# Patient Record
Sex: Female | Born: 1950 | Race: Black or African American | Hispanic: No | Marital: Single | Smoking: Never smoker
Health system: Southern US, Community
[De-identification: ages and names within clinical notes are randomized; demographics above are authoritative.]

## PROBLEM LIST (undated history)

## (undated) DIAGNOSIS — I10 Essential (primary) hypertension: Secondary | ICD-10-CM

## (undated) DIAGNOSIS — E119 Type 2 diabetes mellitus without complications: Secondary | ICD-10-CM

## (undated) DIAGNOSIS — N289 Disorder of kidney and ureter, unspecified: Secondary | ICD-10-CM

## (undated) DIAGNOSIS — N186 End stage renal disease: Secondary | ICD-10-CM

---

## 2011-07-13 ENCOUNTER — Institutional Professional Consult (permissible substitution): Payer: Self-pay | Admitting: Internal Medicine

## 2018-06-15 ENCOUNTER — Other Ambulatory Visit: Payer: Self-pay

## 2018-06-15 ENCOUNTER — Emergency Department (HOSPITAL_COMMUNITY): Payer: Medicare Other

## 2018-06-15 ENCOUNTER — Encounter (HOSPITAL_COMMUNITY): Payer: Self-pay

## 2018-06-15 ENCOUNTER — Emergency Department (HOSPITAL_COMMUNITY)
Admission: EM | Admit: 2018-06-15 | Discharge: 2018-06-16 | Disposition: A | Discharge status: Home / Self Care | Payer: Medicare Other | Attending: Emergency Medicine | Admitting: Emergency Medicine

## 2018-06-15 DIAGNOSIS — R059 Cough, unspecified: Secondary | ICD-10-CM

## 2018-06-15 DIAGNOSIS — E119 Type 2 diabetes mellitus without complications: Secondary | ICD-10-CM | POA: Insufficient documentation

## 2018-06-15 DIAGNOSIS — R062 Wheezing: Secondary | ICD-10-CM | POA: Diagnosis not present

## 2018-06-15 DIAGNOSIS — R05 Cough: Secondary | ICD-10-CM

## 2018-06-15 DIAGNOSIS — I10 Essential (primary) hypertension: Secondary | ICD-10-CM | POA: Diagnosis not present

## 2018-06-15 HISTORY — DX: Type 2 diabetes mellitus without complications: E11.9

## 2018-06-15 HISTORY — DX: Essential (primary) hypertension: I10

## 2018-06-15 HISTORY — DX: Disorder of kidney and ureter, unspecified: N28.9

## 2018-06-15 LAB — COMPREHENSIVE METABOLIC PANEL
ALBUMIN: 3.4 g/dL — AB (ref 3.5–5.0)
ALT: 12 U/L — AB (ref 14–54)
AST: 18 U/L (ref 15–41)
Alkaline Phosphatase: 74 U/L (ref 38–126)
Anion gap: 12 (ref 5–15)
BUN: 14 mg/dL (ref 6–20)
CHLORIDE: 98 mmol/L — AB (ref 101–111)
CO2: 30 mmol/L (ref 22–32)
CREATININE: 6.37 mg/dL — AB (ref 0.44–1.00)
Calcium: 8.2 mg/dL — ABNORMAL LOW (ref 8.9–10.3)
GFR calc Af Amer: 7 mL/min — ABNORMAL LOW (ref >60)
GFR, EST NON AFRICAN AMERICAN: 6 mL/min — AB (ref >60)
GLUCOSE: 207 mg/dL — AB (ref 65–99)
Potassium: 4.4 mmol/L (ref 3.5–5.1)
Sodium: 140 mmol/L (ref 135–145)
Total Bilirubin: 0.9 mg/dL (ref 0.3–1.2)
Total Protein: 7.7 g/dL (ref 6.5–8.1)

## 2018-06-15 LAB — CBC
HEMATOCRIT: 39.7 % (ref 36.0–46.0)
HEMOGLOBIN: 11.8 g/dL — AB (ref 12.0–15.0)
MCH: 27.1 pg (ref 26.0–34.0)
MCHC: 29.7 g/dL — AB (ref 30.0–36.0)
MCV: 91.3 fL (ref 78.0–100.0)
Platelets: 245 10*3/uL (ref 150–400)
RBC: 4.35 MIL/uL (ref 3.87–5.11)
RDW: 16.5 % — ABNORMAL HIGH (ref 11.5–15.5)
WBC: 6.2 10*3/uL (ref 4.0–10.5)

## 2018-06-15 LAB — LIPASE, BLOOD: LIPASE: 24 U/L (ref 11–51)

## 2018-06-15 LAB — CBG MONITORING, ED: Glucose-Capillary: 136 mg/dL — ABNORMAL HIGH (ref 65–99)

## 2018-06-15 LAB — I-STAT TROPONIN, ED: Troponin i, poc: 0 ng/mL (ref 0.00–0.08)

## 2018-06-15 MED ORDER — ALBUTEROL SULFATE HFA 108 (90 BASE) MCG/ACT IN AERS: 2.0000 | INHALATION_SPRAY | RESPIRATORY_TRACT | 1 refills | Status: DC | PRN

## 2018-06-15 MED ORDER — GI COCKTAIL ~~LOC~~
30.0000 mL | Freq: Once | ORAL | Status: AC
  Administered 2018-06-15: 30 mL via ORAL
  Filled 2018-06-15: qty 30

## 2018-06-15 MED ORDER — FAMOTIDINE 20 MG PO TABS: 20.0000 mg | ORAL_TABLET | Freq: Two times a day (BID) | ORAL | 0 refills | Status: AC

## 2018-06-15 MED ORDER — BENZONATATE 100 MG PO CAPS: 200.0000 mg | ORAL_CAPSULE | Freq: Two times a day (BID) | ORAL | 0 refills | Status: AC | PRN

## 2018-06-15 MED ORDER — AEROCHAMBER PLUS W/MASK MISC: 2 refills | Status: DC

## 2018-06-15 MED ORDER — ACETAMINOPHEN 325 MG PO TABS
650.0000 mg | ORAL_TABLET | Freq: Once | ORAL | Status: AC
  Administered 2018-06-15: 650 mg via ORAL
  Filled 2018-06-15: qty 2

## 2018-06-15 MED ORDER — ALBUTEROL SULFATE (2.5 MG/3ML) 0.083% IN NEBU
5.0000 mg | INHALATION_SOLUTION | Freq: Once | RESPIRATORY_TRACT | Status: AC
  Administered 2018-06-15: 5 mg via RESPIRATORY_TRACT
  Filled 2018-06-15: qty 6

## 2018-06-15 NOTE — ED Provider Notes (Signed)
[place] EMERGENCY DEPARTMENT Provider Note   CSN: [id number] Arrival date & time: 06/15/18  1759     History   Chief Complaint Chief Complaint  Patient presents with  . Chest Pain    HPI Patricia Bradley is a 67 y.o. female who presents the emergency department with chief complaint of cough.  Patient has a past medical history of diabetes, hypertension, underlying lung disease, chronic kidney disease.  Patient was seen at the [facility] system on 05/25/2018 for productive cough.  She was treated for suspected pneumonia and states that she finished her antibiotic course on Friday, 06/10/2018.  Patient states that she was feeling much better and her cough had resolved however over the weekend she started feeling worse in my Monday she developed chest congestion, productive cough chills and body aches.  She is unsure of what antibiotic she took.  She has also had associated epigastric abdominal pain, nausea and worsening reflux symptoms.  She states that these also worsen when she eats any food.  She denies hemoptysis.  HPI  Past Medical History:  Diagnosis Date  . Diabetes mellitus without complication ([place])   . Hypertension   . Renal disorder       The histories are not reviewed yet. Please review them in the "History" navigator section and refresh this [place].   OB History   None      Home Medications    Prior to Admission medications   Not on File    Family History No family history on file.  Social History Social History   Tobacco Use  . Smoking status: Never Smoker  Substance Use Topics  . Alcohol use: Never    Frequency: Never  . Drug use: Never     Allergies   Patient has no known allergies.   Review of Systems Review of Systems Ten systems reviewed and are negative for acute change, except as noted in the HPI.    Physical Exam Updated Vital Signs BP (!) 157/114 (BP Location: Left Arm)   Pulse 77   Temp 100 F (37.8  C) (Oral)   Resp 18   Ht 5' 4.75" (1.645 m)   Wt 118.4 kg (261 lb)   SpO2 99%   BMI 43.77 kg/m   Physical Exam  Constitutional: She is oriented to person, place, and time. She appears well-developed and well-nourished. No distress.  HENT:  Head: Normocephalic and atraumatic.  Eyes: Pupils are equal, round, and reactive to light. Conjunctivae and EOM are normal. No scleral icterus.  Neck: Normal range of motion. No JVD present.  Cardiovascular: Normal rate, regular rhythm and normal heart sounds. Exam reveals no gallop and no friction rub.  No murmur heard. Pulmonary/Chest: Effort normal and breath sounds normal. No respiratory distress. She has no wheezes.  Abdominal: Soft. Bowel sounds are normal. She exhibits no distension and no mass. There is no tenderness. There is no guarding.  Musculoskeletal:       Right lower leg: She exhibits no edema.       Left lower leg: She exhibits no edema.  Neurological: She is alert and oriented to person, place, and time.  Skin: Skin is warm and dry. She is not diaphoretic.  Psychiatric: Her behavior is normal.  Nursing note and vitals reviewed.    ED Treatments / Results  Labs (all labs ordered are listed, but only abnormal results are displayed) Labs Reviewed  CBC - Abnormal; Notable for the following components:  Result Value   Hemoglobin 11.8 (*)    MCHC 29.7 (*)    RDW 16.5 (*)    All other components within normal limits  COMPREHENSIVE METABOLIC PANEL - Abnormal; Notable for the following components:   Chloride 98 (*)    Glucose, Bld 207 (*)    Creatinine, Ser 6.37 (*)    Calcium 8.2 (*)    Albumin 3.4 (*)    ALT 12 (*)    GFR calc non Af Amer 6 (*)    GFR calc Af Amer 7 (*)    All other components within normal limits  LIPASE, BLOOD  URINALYSIS, ROUTINE W REFLEX MICROSCOPIC  I-STAT TROPONIN, ED    EKG EKG Interpretation  Date/Time:  Wednesday June 15 2018 18:07:58 EDT Ventricular Rate:  75 PR Interval:  172 QRS  Duration: 66 QT Interval:  416 QTC Calculation: 464 R Axis:   -78 Text Interpretation:  Normal sinus rhythm Left axis deviation Low voltage QRS Cannot rule out Anteroseptal infarct , age undetermined Abnormal ECG no prior avaiable for comparison Confirmed by Quintella Reichert [phone]) on 06/15/2018 6:49:03 PM   Radiology Dg Chest 2 View  Result Date: 06/15/2018 CLINICAL DATA:  Mid chest pain intermittently for 3 days. EXAM: CHEST - 2 VIEW COMPARISON:  None. FINDINGS: The heart size and mediastinal contours are within normal limits. Mild bibasilar atelectasis. No pulmonary consolidation, effusion or pneumothorax. No overt pulmonary edema. The visualized skeletal structures are unremarkable. IMPRESSION: No active cardiopulmonary disease. Electronically Signed   By: Ashley Royalty M.D.   On: 06/15/2018 19:28    Procedures Procedures (including critical care time)  Medications Ordered in ED Medications  acetaminophen (TYLENOL) tablet 650 mg (650 mg Oral Given 06/15/18 1813)     Initial Impression / Assessment and Plan / ED Course  I have reviewed the triage vital signs and the nursing notes.  Pertinent labs & imaging results that were available during my care of the patient were reviewed by me and considered in my medical decision making (see chart for details).     Patient with cough.  Chest x-ray negative.  Improved after albuterol treatment.  Feel that her cough is likely multifactorial and may also be secondary to her chronic reflux.  Symptoms improved with treatment.  Patient will be discharged with Tessalon, albuterol, and I have added Pepcid to her daily regimen of PPI.  Suggest also dietary modifications.  Patient hemodynamically stable with normal oxygen saturations.  She appears appropriate for discharge at this time  Final Clinical Impressions(s) / ED Diagnoses   Final diagnoses:  Cough  Wheezing    ED Discharge Orders    None       Margarita Mail, PA-C 06/15/18 2358      Quintella Reichert, MD 06/22/18 [phone]

## 2018-06-15 NOTE — ED Notes (Signed)
Pt to xray

## 2018-06-15 NOTE — ED Notes (Signed)
Pt CBG was 136, notified Chris(RN)

## 2018-06-15 NOTE — ED Provider Notes (Signed)
Patient placed in Quick Look pathway, seen and evaluated   Chief Complaint: Chest pain, SOB, Abdominal pain  HPI:   67 year old female with ESRD presents with chest pain for one day. She reports associated cough, SOB, abdominal pain, and nausea. No diarrhea or urinary symptoms. She completed dialysis today. She has been on antibiotics recently for a cough and her cough improved however her cough has worsened again.  ROS: +chest pain, SOB, cough, abdominal pain  Physical Exam:   Gen: No distress  Neuro: Awake and Alert  Skin: Warm    Focused Exam: Heart: Regular rate and rhythm    Lungs: CTA    Abdomen: Soft, tender in epigastric area   Initiation of care has begun. The patient has been counseled on the process, plan, and necessity for staying for the completion/evaluation, and the remainder of the medical screening examination    Recardo Evangelist, PA-C 06/15/18 1811    Quintella Reichert, MD 06/17/18 [phone]

## 2018-06-15 NOTE — Discharge Instructions (Addendum)
Contact a health care provider if:  You have new symptoms.  You cough up pus.  Your cough does not get better after 2-3 weeks, or your cough gets worse.  You cannot control your cough with suppressant medicines and you are losing sleep.  You develop pain that is getting worse or pain that is not controlled with pain medicines.  You have a fever.  You have unexplained weight loss.  You have night sweats.  Get help right away if:  You cough up blood.  You have difficulty breathing.  Your heartbeat is very fast.

## 2018-06-15 NOTE — ED Triage Notes (Addendum)
Pt endorses central non radiating chest pain since yesterday with dizziness, shob, and nausea. Oral temp 100 in triage. Pt is on dialysis m-w-fr. Pt had full dialysis treatment today.

## 2018-06-15 NOTE — ED Notes (Signed)
The pt has multiple complaints chest abdiomen shortness of breath hypertension  She has had all for awhile  But she reports worse in the past 3-4 days

## 2020-12-07 ENCOUNTER — Emergency Department (HOSPITAL_BASED_OUTPATIENT_CLINIC_OR_DEPARTMENT_OTHER): Payer: Medicare Other

## 2020-12-07 ENCOUNTER — Encounter (HOSPITAL_BASED_OUTPATIENT_CLINIC_OR_DEPARTMENT_OTHER): Payer: Self-pay | Admitting: Emergency Medicine

## 2020-12-07 ENCOUNTER — Emergency Department (HOSPITAL_BASED_OUTPATIENT_CLINIC_OR_DEPARTMENT_OTHER)
Admission: EM | Admit: 2020-12-07 | Discharge: 2020-12-07 | Disposition: A | Discharge status: Home / Self Care | Payer: Medicare Other | Attending: Emergency Medicine | Admitting: Emergency Medicine

## 2020-12-07 ENCOUNTER — Other Ambulatory Visit: Payer: Self-pay

## 2020-12-07 DIAGNOSIS — R202 Paresthesia of skin: Secondary | ICD-10-CM | POA: Insufficient documentation

## 2020-12-07 DIAGNOSIS — E041 Nontoxic single thyroid nodule: Secondary | ICD-10-CM | POA: Insufficient documentation

## 2020-12-07 DIAGNOSIS — I10 Essential (primary) hypertension: Secondary | ICD-10-CM | POA: Insufficient documentation

## 2020-12-07 DIAGNOSIS — K5730 Diverticulosis of large intestine without perforation or abscess without bleeding: Secondary | ICD-10-CM | POA: Insufficient documentation

## 2020-12-07 DIAGNOSIS — Z79899 Other long term (current) drug therapy: Secondary | ICD-10-CM | POA: Insufficient documentation

## 2020-12-07 DIAGNOSIS — E119 Type 2 diabetes mellitus without complications: Secondary | ICD-10-CM | POA: Insufficient documentation

## 2020-12-07 DIAGNOSIS — R0602 Shortness of breath: Secondary | ICD-10-CM | POA: Insufficient documentation

## 2020-12-07 DIAGNOSIS — R911 Solitary pulmonary nodule: Secondary | ICD-10-CM | POA: Diagnosis not present

## 2020-12-07 DIAGNOSIS — Z794 Long term (current) use of insulin: Secondary | ICD-10-CM | POA: Insufficient documentation

## 2020-12-07 DIAGNOSIS — Z992 Dependence on renal dialysis: Secondary | ICD-10-CM | POA: Diagnosis not present

## 2020-12-07 DIAGNOSIS — Z20822 Contact with and (suspected) exposure to covid-19: Secondary | ICD-10-CM | POA: Insufficient documentation

## 2020-12-07 DIAGNOSIS — I2510 Atherosclerotic heart disease of native coronary artery without angina pectoris: Secondary | ICD-10-CM | POA: Diagnosis not present

## 2020-12-07 DIAGNOSIS — R0789 Other chest pain: Secondary | ICD-10-CM | POA: Diagnosis not present

## 2020-12-07 DIAGNOSIS — Z7982 Long term (current) use of aspirin: Secondary | ICD-10-CM | POA: Insufficient documentation

## 2020-12-07 DIAGNOSIS — I700 Atherosclerosis of aorta: Secondary | ICD-10-CM | POA: Diagnosis not present

## 2020-12-07 DIAGNOSIS — R197 Diarrhea, unspecified: Secondary | ICD-10-CM | POA: Diagnosis present

## 2020-12-07 DIAGNOSIS — J989 Respiratory disorder, unspecified: Secondary | ICD-10-CM

## 2020-12-07 DIAGNOSIS — B349 Viral infection, unspecified: Secondary | ICD-10-CM | POA: Diagnosis not present

## 2020-12-07 LAB — CBC WITH DIFFERENTIAL/PLATELET
Abs Immature Granulocytes: 0.03 10*3/uL (ref 0.00–0.07)
Basophils Absolute: 0.1 10*3/uL (ref 0.0–0.1)
Basophils Relative: 1 %
Eosinophils Absolute: 0.1 10*3/uL (ref 0.0–0.5)
Eosinophils Relative: 1 %
HCT: 36.3 % (ref 36.0–46.0)
Hemoglobin: 11.2 g/dL — ABNORMAL LOW (ref 12.0–15.0)
Immature Granulocytes: 0 %
Lymphocytes Relative: 20 %
Lymphs Abs: 2.1 10*3/uL (ref 0.7–4.0)
MCH: 27.9 pg (ref 26.0–34.0)
MCHC: 30.9 g/dL (ref 30.0–36.0)
MCV: 90.3 fL (ref 80.0–100.0)
Monocytes Absolute: 0.6 10*3/uL (ref 0.1–1.0)
Monocytes Relative: 6 %
Neutro Abs: 7.7 10*3/uL (ref 1.7–7.7)
Neutrophils Relative %: 72 %
Platelets: 300 10*3/uL (ref 150–400)
RBC: 4.02 MIL/uL (ref 3.87–5.11)
RDW: 15.8 % — ABNORMAL HIGH (ref 11.5–15.5)
WBC: 10.6 10*3/uL — ABNORMAL HIGH (ref 4.0–10.5)
nRBC: 0 % (ref 0.0–0.2)

## 2020-12-07 LAB — COMPREHENSIVE METABOLIC PANEL
ALT: 14 U/L (ref 0–44)
AST: 15 U/L (ref 15–41)
Albumin: 3.5 g/dL (ref 3.5–5.0)
Alkaline Phosphatase: 81 U/L (ref 38–126)
Anion gap: 16 — ABNORMAL HIGH (ref 5–15)
BUN: 44 mg/dL — ABNORMAL HIGH (ref 8–23)
CO2: 25 mmol/L (ref 22–32)
Calcium: 7.8 mg/dL — ABNORMAL LOW (ref 8.9–10.3)
Chloride: 93 mmol/L — ABNORMAL LOW (ref 98–111)
Creatinine, Ser: 9.44 mg/dL — ABNORMAL HIGH (ref 0.44–1.00)
GFR, Estimated: 4 mL/min — ABNORMAL LOW (ref >60)
Glucose, Bld: 294 mg/dL — ABNORMAL HIGH (ref 70–99)
Potassium: 4.4 mmol/L (ref 3.5–5.1)
Sodium: 134 mmol/L — ABNORMAL LOW (ref 135–145)
Total Bilirubin: 0.3 mg/dL (ref 0.3–1.2)
Total Protein: 7.8 g/dL (ref 6.5–8.1)

## 2020-12-07 LAB — RESP PANEL BY RT-PCR (FLU A&B, COVID) ARPGX2
Influenza A by PCR: NEGATIVE
Influenza B by PCR: NEGATIVE
SARS Coronavirus 2 by RT PCR: NEGATIVE

## 2020-12-07 LAB — TROPONIN I (HIGH SENSITIVITY)
Troponin I (High Sensitivity): 9 ng/L (ref <18)
Troponin I (High Sensitivity): 9 ng/L (ref <18)

## 2020-12-07 LAB — LIPASE, BLOOD: Lipase: 24 U/L (ref 11–51)

## 2020-12-07 LAB — BRAIN NATRIURETIC PEPTIDE: B Natriuretic Peptide: 30 pg/mL (ref 0.0–100.0)

## 2020-12-07 LAB — MAGNESIUM: Magnesium: 1.9 mg/dL (ref 1.7–2.4)

## 2020-12-07 MED ORDER — IOHEXOL 300 MG/ML  SOLN: 100.0000 mL | Freq: Once | INTRAMUSCULAR | Status: DC

## 2020-12-07 NOTE — ED Provider Notes (Signed)
[place] EMERGENCY DEPARTMENT Provider Note   CSN: [id number] Arrival date & time: 12/07/20  1620     History Chief Complaint  Patient presents with  . Chest Pain  . Diarrhea    Patricia Bradley is a 69 y.o. female.  Patient is a dialysis patient.  Normally dialyzed Monday Wednesdays and Fridays.  Was dialyzed on Friday.  She is got multiple complaints.  1 is chest pain for 3 to 4 days intermittent in nature.  Would last for 30 minutes when it occurs.  Also generalized weakness not feeling well fell Thursday but did had no injuries.  Other symptoms include cough chills some diarrhea nausea but no vomiting as she had 4 loose bowel movements today.  Has had both Covid vaccines.  No fever.  Also had some mild numbness to both bilateral upper extremities.  All the symptoms have been present for 3 to 4 days.  He has been receiving dialysis for 4 years.        Past Medical History:  Diagnosis Date  . Diabetes mellitus without complication ([place])   . Hypertension   . Renal disorder     There are no problems to display for this patient.   History reviewed. No pertinent surgical history.   OB History   No obstetric history on file.     No family history on file.  Social History   Tobacco Use  . Smoking status: Never Smoker  . Smokeless tobacco: Never Used  Substance Use Topics  . Alcohol use: Never  . Drug use: Never    Home Medications Prior to Admission medications   Medication Sig Start Date End Date Taking? Authorizing Provider  albuterol (PROVENTIL HFA;VENTOLIN HFA) 108 (90 Base) MCG/ACT inhaler Inhale 2 puffs into the lungs every 6 (six) hours as needed for wheezing or shortness of breath.    [provider]  albuterol (PROVENTIL HFA;VENTOLIN HFA) 108 (90 Base) MCG/ACT inhaler Inhale 2 puffs into the lungs every 4 (four) hours as needed for wheezing or shortness of breath. 06/15/18   Margarita Mail, PA-C  ALPRAZolam Duanne Moron) 0.25 MG tablet  Take 0.25 mg by mouth daily as needed for anxiety.    [provider]  amLODipine (NORVASC) 5 MG tablet Take 5 mg by mouth daily.    [provider]  aspirin EC 81 MG tablet Take 81 mg by mouth daily.    [provider]  benzonatate (TESSALON) 100 MG capsule Take 2 capsules (200 mg total) by mouth 2 (two) times daily as needed for cough. 06/15/18   Margarita Mail, PA-C  calcium acetate (PHOSLO) 667 MG capsule Take [phone] mg by mouth See admin instructions. Take 1334mg  three times a day after meals, Take 667mg  after snacks    [provider]  cetirizine (ZYRTEC) 10 MG tablet Take 5 mg by mouth daily.    [provider]  Cholecalciferol 2000 units CAPS Take 2,000 Units by mouth daily.    [provider]  clobetasol ointment (TEMOVATE) 6.20 % Apply 1 application topically daily. To feet    [provider]  clotrimazole-betamethasone (LOTRISONE) lotion Apply 1 application topically 2 (two) times daily.    [provider]  esomeprazole (NEXIUM) 40 MG capsule Take 40 mg by mouth daily at 12 noon.    [provider]  famotidine (PEPCID) 20 MG tablet Take 1 tablet (20 mg total) by mouth 2 (two) times daily. 06/15/18   Margarita Mail, PA-C  febuxostat (ULORIC) 40 MG  tablet Take 40 mg by mouth daily.    [provider]  fluticasone (FLONASE) 50 MCG/ACT nasal spray Place 1 spray into both nostrils daily.    [provider]  Fluticasone-Salmeterol (ADVAIR) 100-50 MCG/DOSE AEPB Inhale 1 puff into the lungs 2 (two) times daily.    [provider]  gabapentin (NEURONTIN) 300 MG capsule Take 300 mg by mouth at bedtime.    [provider]  hydrocortisone 2.5 % cream Apply 1 application topically 2 (two) times daily.    [provider]  Insulin Glargine (BASAGLAR KWIKPEN) 100 UNIT/ML SOPN Inject 10 Units into the skin at bedtime.    [provider]  insulin lispro (HUMALOG KWIKPEN)  100 UNIT/ML KiwkPen Inject 3 Units into the skin 3 (three) times daily. Before meals    [provider]  ipratropium (ATROVENT) 0.06 % nasal spray Place 2 sprays into both nostrils 3 (three) times daily.    [provider]  isosorbide mononitrate (IMDUR) 60 MG 24 hr tablet Take 60 mg by mouth daily.    [provider]  lidocaine (XYLOCAINE) 5 % ointment Apply 1 application topically daily as needed for mild pain.    [provider]  metoprolol succinate (TOPROL-XL) 50 MG 24 hr tablet Take 50 mg by mouth daily. Take with or immediately following a meal.    [provider]  Multiple Vitamins-Minerals (CENTRUM SILVER 50+WOMEN) TABS Take 1 tablet by mouth daily.    [provider]  Naftifine HCl (NAFTIN) 2 % CREA Apply 1 g topically 2 (two) times daily. Starting at feet    [provider]  nitroGLYCERIN (NITROSTAT) 0.4 MG SL tablet Place 0.4 mg under the tongue every 5 (five) minutes as needed for chest pain.    [provider]  NON FORMULARY CPAP Nightly    [provider]  omega-3 fish oil (MAXEPA) 1000 MG CAPS capsule Take 1 capsule by mouth daily.    [provider]  ondansetron (ZOFRAN-ODT) 4 MG disintegrating tablet Take 4 mg by mouth every 8 (eight) hours as needed for nausea or vomiting.    [provider]  rosuvastatin (CRESTOR) 10 MG tablet Take 10 mg by mouth daily.    [provider]  Spacer/Aero-Holding Chambers (AEROCHAMBER PLUS WITH MASK) inhaler Use as instructed 06/15/18   Margarita Mail, PA-C  topiramate (TOPAMAX) 100 MG tablet Take 100 mg by mouth at bedtime.    [provider]  torsemide (DEMADEX) 20 MG tablet Take 40 mg by mouth daily.    [provider]    Allergies    Tramadol, Memantine, Metformin, Penicillins, Contrast media [iodinated diagnostic agents], Aspirin, Fentanyl, and Hydrocodone-acetaminophen  Review of Systems   Review of Systems   Constitutional: Positive for chills and fatigue. Negative for fever.  HENT: Negative for congestion, rhinorrhea and sore throat.   Eyes: Negative for visual disturbance.  Respiratory: Positive for cough. Negative for shortness of breath.   Cardiovascular: Positive for chest pain. Negative for leg swelling.  Gastrointestinal: Positive for diarrhea and nausea. Negative for abdominal pain and vomiting.  Genitourinary: Negative for dysuria.  Musculoskeletal: Negative for back pain and neck pain.  Skin: Negative for rash.  Neurological: Positive for numbness. Negative for dizziness, light-headedness and headaches.  Hematological: Does not bruise/bleed easily.  Psychiatric/Behavioral: Negative for confusion.    Physical Exam Updated Vital Signs BP (!) 98/55 (BP Location: Left Arm)   Pulse 74   Temp 98 F (36.7 C) (Oral)   Resp  16   Ht 1.626 m (5\' 4" )   Wt 97.1 kg   SpO2 96%   BMI 36.73 kg/m   Physical Exam Vitals and nursing note reviewed.  Constitutional:      General: She is not in acute distress.    Appearance: Normal appearance. She is well-developed and well-nourished. She is not ill-appearing or toxic-appearing.  HENT:     Head: Normocephalic and atraumatic.     Mouth/Throat:     Mouth: Mucous membranes are moist.  Eyes:     Extraocular Movements: Extraocular movements intact.     Conjunctiva/sclera: Conjunctivae normal.     Pupils: Pupils are equal, round, and reactive to light.  Cardiovascular:     Rate and Rhythm: Normal rate and regular rhythm.     Heart sounds: No murmur heard.   Pulmonary:     Effort: Pulmonary effort is normal. No respiratory distress.     Breath sounds: Normal breath sounds.  Abdominal:     Palpations: Abdomen is soft.     Tenderness: There is no abdominal tenderness.  Musculoskeletal:        General: No swelling or edema. Normal range of motion.     Cervical back: Normal range of motion and neck supple.     Comments: AV fistula right  forearm.  With good thrill  Skin:    General: Skin is warm and dry.     Capillary Refill: Capillary refill takes less than 2 seconds.  Neurological:     General: No focal deficit present.     Mental Status: She is alert and oriented to person, place, and time.     Cranial Nerves: No cranial nerve deficit.     Sensory: No sensory deficit.     Motor: No weakness.  Psychiatric:        Mood and Affect: Mood and affect normal.     ED Results / Procedures / Treatments   Labs (all labs ordered are listed, but only abnormal results are displayed) Labs Reviewed  COMPREHENSIVE METABOLIC PANEL - Abnormal; Notable for the following components:      Result Value   Sodium 134 (*)    Chloride 93 (*)    Glucose, Bld 294 (*)    BUN 44 (*)    Creatinine, Ser 9.44 (*)    Calcium 7.8 (*)    GFR, Estimated 4 (*)    Anion gap 16 (*)    All other components within normal limits  CBC WITH DIFFERENTIAL/PLATELET - Abnormal; Notable for the following components:   WBC 10.6 (*)    Hemoglobin 11.2 (*)    RDW 15.8 (*)    All other components within normal limits  RESP PANEL BY RT-PCR (FLU A&B, COVID) ARPGX2  BRAIN NATRIURETIC PEPTIDE  LIPASE, BLOOD  MAGNESIUM  TROPONIN I (HIGH SENSITIVITY)  TROPONIN I (HIGH SENSITIVITY)    EKG EKG Interpretation  Date/Time:  Saturday December 07 2020 16:24:19 EST Ventricular Rate:  97 PR Interval:  166 QRS Duration: 76 QT Interval:  376 QTC Calculation: 477 R Axis:   -80 Text Interpretation: Normal sinus rhythm Left axis deviation Low voltage QRS Inferior infarct , age undetermined Possible Anterolateral infarct , age undetermined Abnormal ECG Confirmed by Fredia Sorrow [phone]) on 12/07/2020 5:28:35 PM   Radiology DG Chest Port 1 View  Result Date: 12/07/2020 CLINICAL DATA:  Central chest pain. EXAM: PORTABLE CHEST 1 VIEW COMPARISON:  09/23/2020 FINDINGS: The cardiomediastinal contours are normal. The lungs are clear. Pulmonary vasculature is  normal.  No consolidation, pleural effusion, or pneumothorax. No acute osseous abnormalities are seen. IMPRESSION: No acute chest findings. Electronically Signed   By: Keith Rake M.D.   On: 12/07/2020 18:34   CT CHEST ABDOMEN PELVIS WO CONTRAST  Result Date: 12/07/2020 CLINICAL DATA:  Respiratory illness, chest pain EXAM: CT CHEST, ABDOMEN AND PELVIS WITHOUT CONTRAST TECHNIQUE: Multidetector CT imaging of the chest, abdomen and pelvis was performed following the standard protocol without IV contrast. COMPARISON:  Chest x-ray today.  Chest CT 09/23/2020 FINDINGS: CT CHEST FINDINGS Cardiovascular: Scattered aortic calcifications and coronary artery calcifications. Heart is normal size. Aorta is normal caliber. Mediastinum/Nodes: No mediastinal, hilar, or axillary adenopathy. Trachea and esophagus are unremarkable. 2 cm right thyroid nodule Lungs/Pleura: 7 mm nodule in the left lower lobe adjacent to the fissure on image 71. No confluent opacities or pleural effusions. Musculoskeletal: Chest wall soft tissues are unremarkable. No acute bony abnormality. CT ABDOMEN PELVIS FINDINGS Hepatobiliary: No focal liver abnormality is seen. Status post cholecystectomy. No biliary dilatation. Pancreas: No focal abnormality or ductal dilatation. Spleen: No focal abnormality.  Normal size. Adrenals/Urinary Tract: Atrophic kidneys. 1.7 cm low-density lesion in the lower pole of the right kidney, likely cyst. No hydronephrosis. Low-density fullness of both adrenal glands, likely hyperplasia. Urinary bladder decompressed. Stomach/Bowel: Sigmoid diverticulosis. No active diverticulitis. Normal appendix. Stomach and small bowel decompressed, unremarkable. Vascular/Lymphatic: Aortic atherosclerosis. No evidence of aneurysm or adenopathy. Reproductive: Prior hysterectomy.  No adnexal masses. Other: No free fluid or free air. Musculoskeletal: No acute bony abnormality. Degenerative changes in the lower lumbar spine. IMPRESSION: Coronary  artery disease, aortic atherosclerosis. 7 mm left lower lobe pulmonary nodule. Non-contrast chest CT at 6-12 months is recommended. If the nodule is stable at time of repeat CT, then future CT at 18-24 months (from today's scan) is considered optional for low-risk patients, but is recommended for high-risk patients. This recommendation follows the consensus statement: Guidelines for Management of Incidental Pulmonary Nodules Detected on CT Images: From the Fleischner Society 2017; Radiology 2017; 284:228-243. 2 cm right thyroid nodule. Recommend thyroid US (ref: J Am Coll Radiol. 2015 Feb;12(2): 143-50). Sigmoid diverticulosis.  No active diverticulitis. Electronically Signed   By: Rolm Baptise M.D.   On: 12/07/2020 20:45    Procedures Procedures (including critical care time)  Medications Ordered in ED Medications  iohexol (OMNIPAQUE) 300 MG/ML solution 100 mL (0 mLs Intravenous Hold 12/07/20 2050)    ED Course  I have reviewed the triage vital signs and the nursing notes.  Pertinent labs & imaging results that were available during my care of the patient were reviewed by me and considered in my medical decision making (see chart for details).    MDM Rules/Calculators/A&P                          Extensive work-up to include troponins x2 which were normal.  Chest x-ray CT chest abdomen and pelvis without any acute findings.  Labs without significant abnormalities.  Other than blood sugar at 294 but no evidence of any acidosis.  Potassium not elevated liver function test without significant abnormalities.  Blood cell count 10,000.  Hemoglobin 11.2.  NP normal at 30.  Patient had dye allergy.  So could not do with contrast.  Patient's blood pressures at time of which.  I were getting accurate readings.  But seem to be most accurate at times either from her left lower leg that they use during dialysis for checking blood  pressures or her left upper extremity.  At discharge with patient sitting we  had a blood pressure 98.  Patient feeling fine feeling much better.  Covid testing influenza testing negative.  Patient was sort of a viral-like symptomatology.  Nontoxic no acute distress.  Stable for discharge home symptomatic treatment and follow-up with dialysis.   Final Clinical Impression(s) / ED Diagnoses Final diagnoses:  Viral illness    Rx / DC Orders ED Discharge Orders    None       Fredia Sorrow, MD 12/07/20 2339

## 2020-12-07 NOTE — ED Notes (Signed)
Patient transported to CT 

## 2020-12-07 NOTE — ED Notes (Signed)
ED Provider at bedside. 

## 2020-12-07 NOTE — Discharge Instructions (Signed)
Follow-up with dialysis on Monday.  Extensive work-up here today without any acute findings.  Return for any new or worse symptoms.  CT chest CT abdomen without any acute findings.  Covid testing influenza testing negative

## 2020-12-07 NOTE — ED Triage Notes (Signed)
Reports central chest pain describes as pressure and pins and needles that make her jerk.  Reports pain radiates into her back. Dialysis patient.  Reports last was on Friday.

## 2020-12-07 NOTE — ED Notes (Signed)
Notified Dr. Rogene Houston about hypotension, states will watch trend. Pt asymptomatic at this time.

## 2021-08-07 IMAGING — CT CT CHEST-ABD-PELV W/O CM
2 of 4 series · 12 of 36 positions shown, 18 images · non-contrast
Comparison: Chest x-ray today.  Chest CT 09/23/2020

CLINICAL DATA: Respiratory illness, chest pain

EXAM:
CT CHEST, ABDOMEN AND PELVIS WITHOUT CONTRAST
TECHNIQUE: Multidetector CT imaging of the chest, abdomen and pelvis was
performed following the standard protocol without IV contrast.

[Series 2: axial st · axial · 0.84mm/px · z∈[-482,+43]mm · 9 of 129 slices shown, 15 images]
[im 12/129  mediastinal]
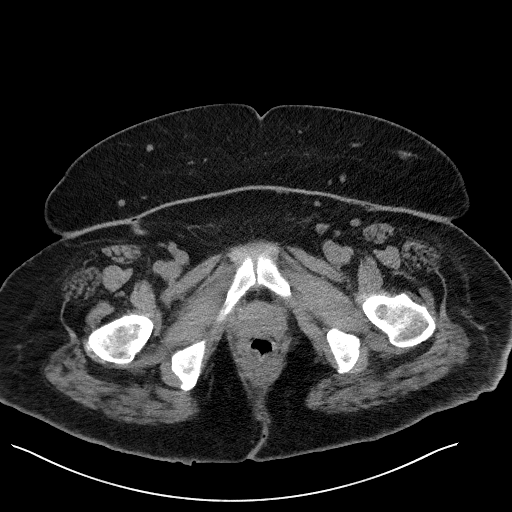
[im 12/129  bone]
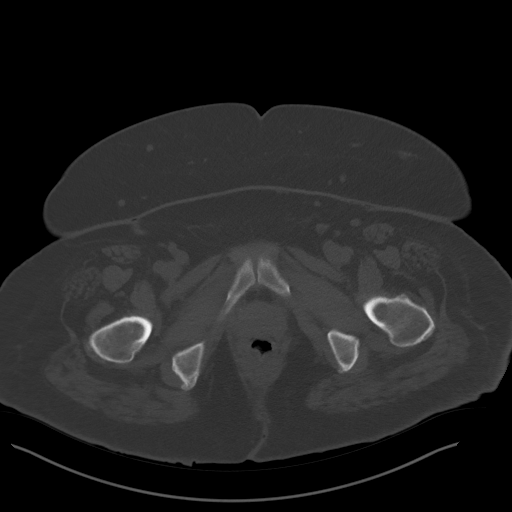
[im 24/129  mediastinal]
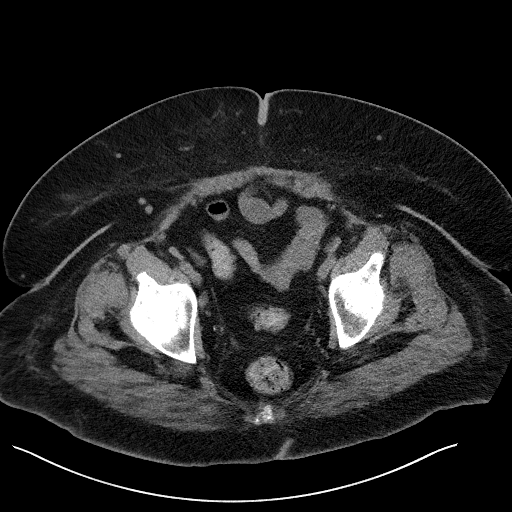
[im 35/129  mediastinal]
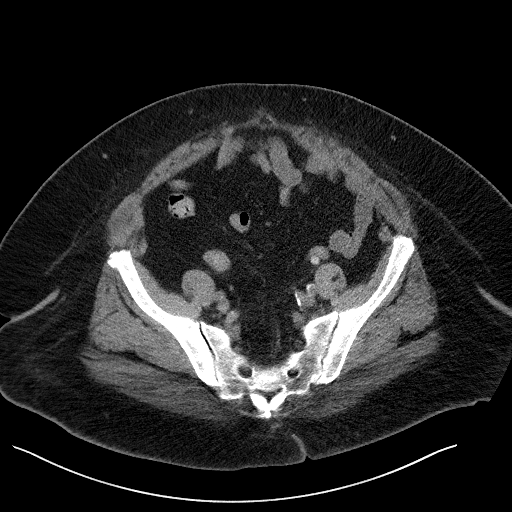
[im 47/129  mediastinal]
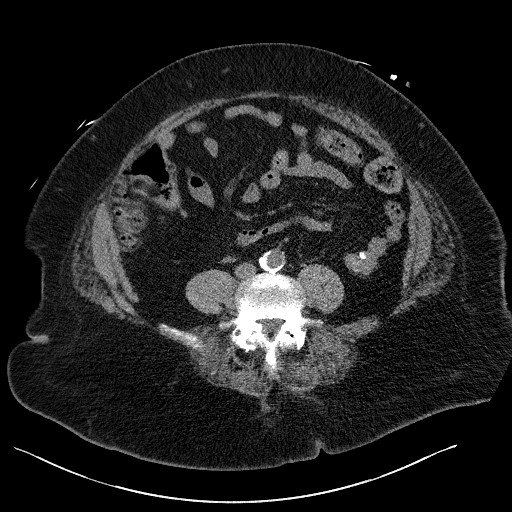
[im 70/129  mediastinal]
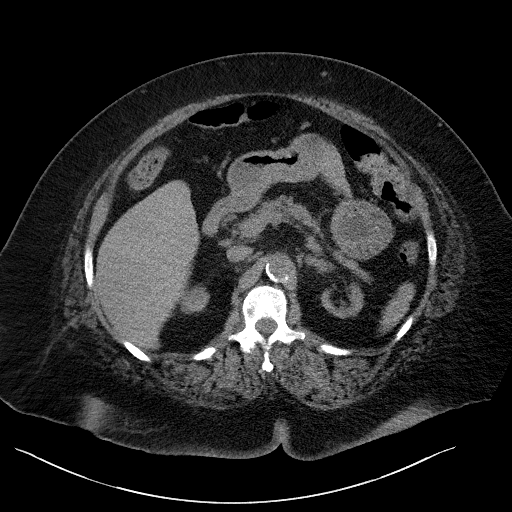
[im 82/129  mediastinal]
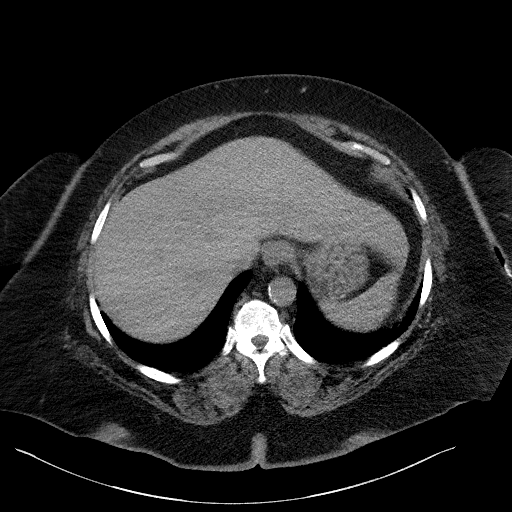
[im 82/129  lung]
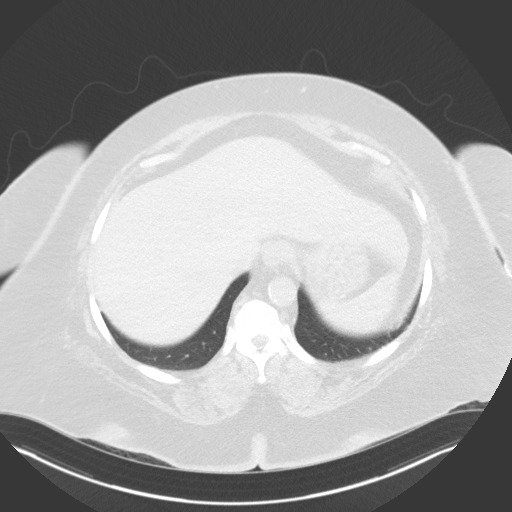
[im 94/129  mediastinal]
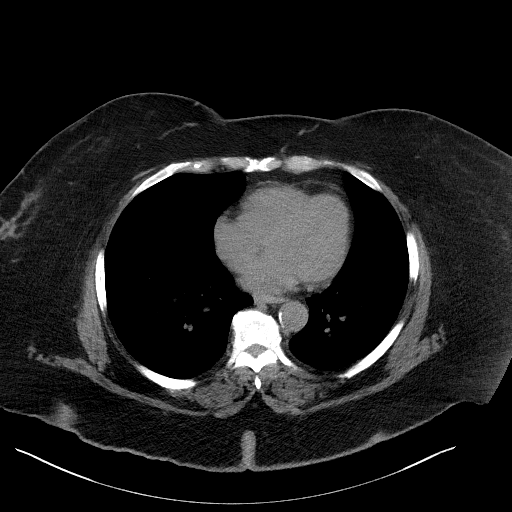
[im 94/129  lung]
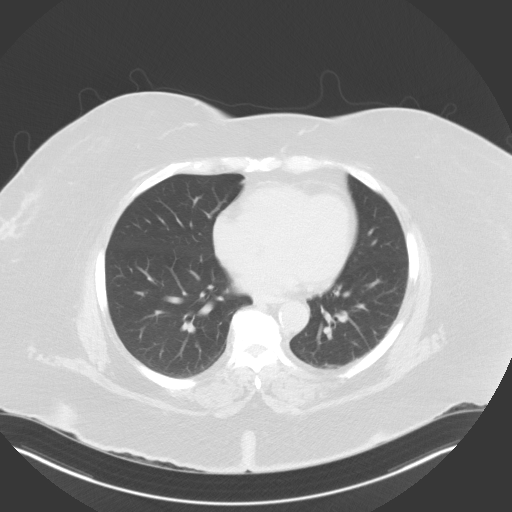
[im 105/129  mediastinal]
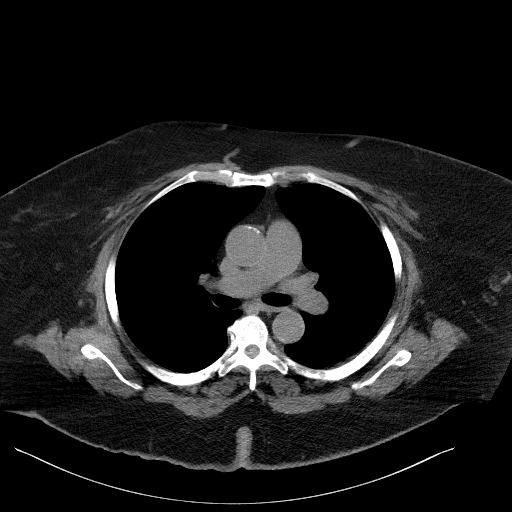
[im 105/129  lung]
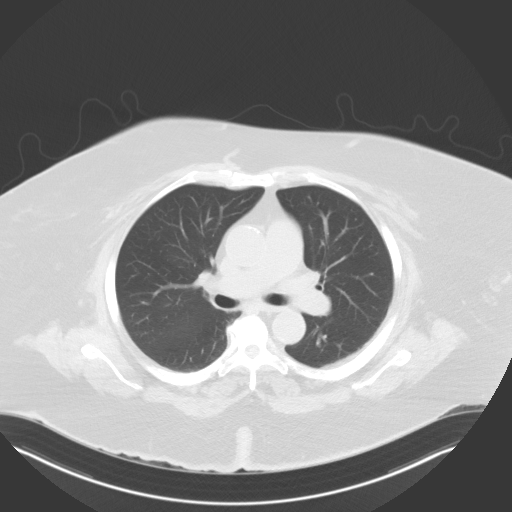
[im 117/129  mediastinal]
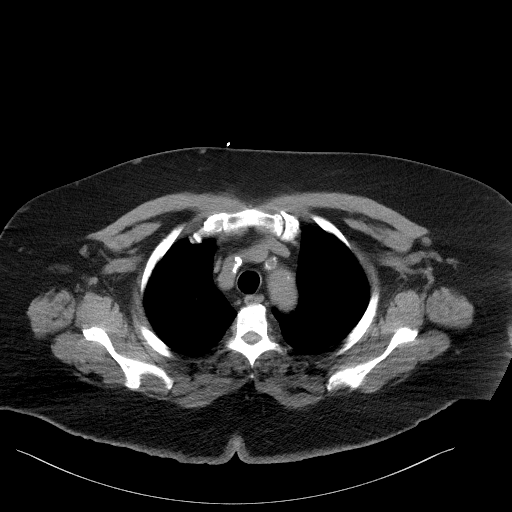
[im 117/129  lung]
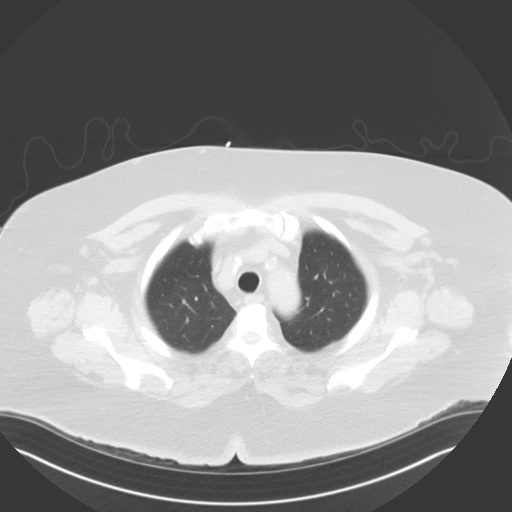
[im 117/129  bone]
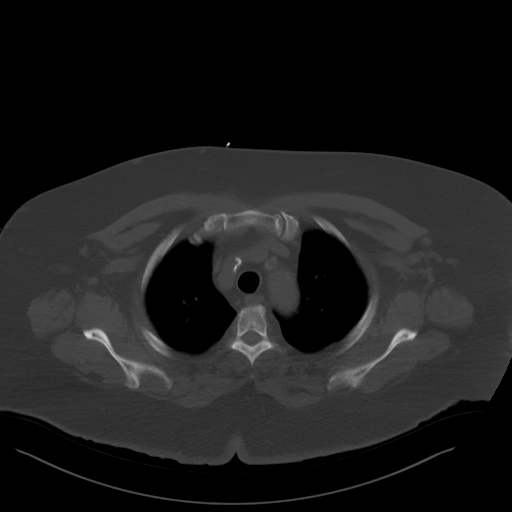

[Series 5: coronal · coronal · 1.25mm/px · 3 of 185 slices shown]
[im 37/185  mediastinal]
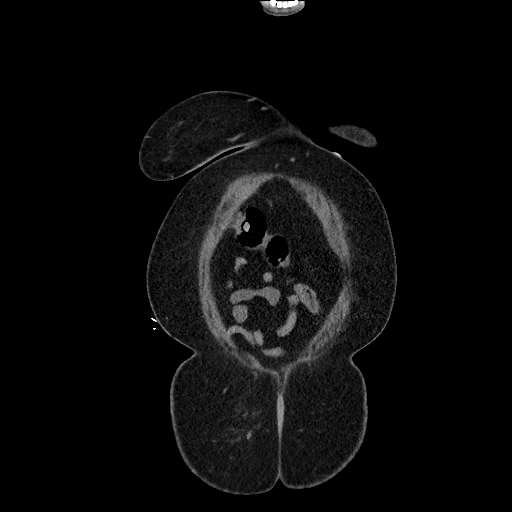
[im 74/185  mediastinal]
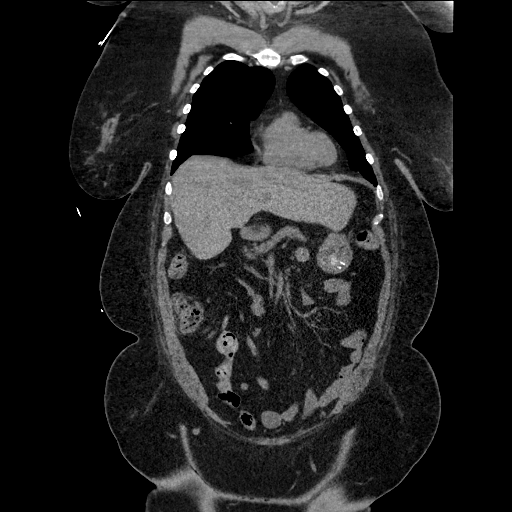
[im 111/185  mediastinal]
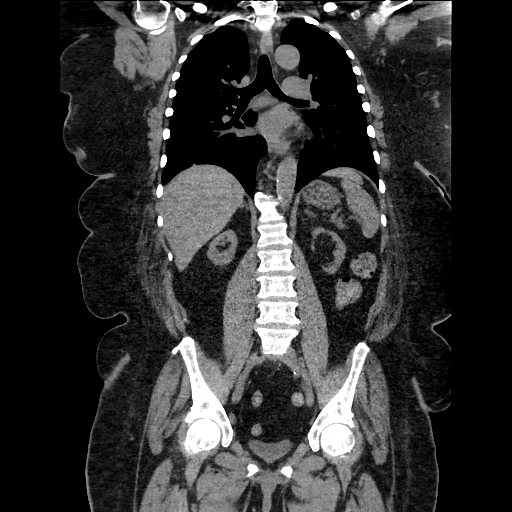

[12 of 36 positions shown; findings below may reference images not displayed]

FINDINGS: CT CHEST FINDINGS

Cardiovascular: Scattered aortic calcifications and coronary artery
calcifications. Heart is normal size. Aorta is normal caliber.

Mediastinum/Nodes: No mediastinal, hilar, or axillary adenopathy.
Trachea and esophagus are unremarkable. 2 cm right thyroid nodule

Lungs/Pleura: 7 mm nodule in the left lower lobe adjacent to the
fissure on image 71. No confluent opacities or pleural effusions.

Musculoskeletal: Chest wall soft tissues are unremarkable. No acute
bony abnormality.

CT ABDOMEN PELVIS FINDINGS

Hepatobiliary: No focal liver abnormality is seen. Status post
cholecystectomy. No biliary dilatation.

Pancreas: No focal abnormality or ductal dilatation.

Spleen: No focal abnormality.  Normal size.

Adrenals/Urinary Tract: Atrophic kidneys. 1.7 cm low-density lesion
in the lower pole of the right kidney, likely cyst. No
hydronephrosis. Low-density fullness of both adrenal glands, likely
hyperplasia. Urinary bladder decompressed.

Stomach/Bowel: Sigmoid diverticulosis. No active diverticulitis.
Normal appendix. Stomach and small bowel decompressed, unremarkable.

Vascular/Lymphatic: Aortic atherosclerosis. No evidence of aneurysm
or adenopathy.

Reproductive: Prior hysterectomy.  No adnexal masses.

Other: No free fluid or free air.

Musculoskeletal: No acute bony abnormality. Degenerative changes in
the lower lumbar spine.
IMPRESSION: Coronary artery disease, aortic atherosclerosis.

7 mm left lower lobe pulmonary nodule. Non-contrast chest CT at 6-12
months is recommended. If the nodule is stable at time of repeat CT,
then future CT at 18-24 months (from today's scan) is considered
optional for low-risk patients, but is recommended for high-risk
patients. This recommendation follows the consensus statement:
Guidelines for Management of Incidental Pulmonary Nodules Detected
[DATE].

2 cm right thyroid nodule. Recommend thyroid US (ref: [HOSPITAL]. [DATE]): 143-50).

Sigmoid diverticulosis.  No active diverticulitis.

## 2021-08-07 IMAGING — DX DG CHEST 1V PORT
1 series · 1 of 1 positions shown · non-contrast
Comparison: 09/23/2020

CLINICAL DATA: Central chest pain.

EXAM:
PORTABLE CHEST 1 VIEW

[chest ap]
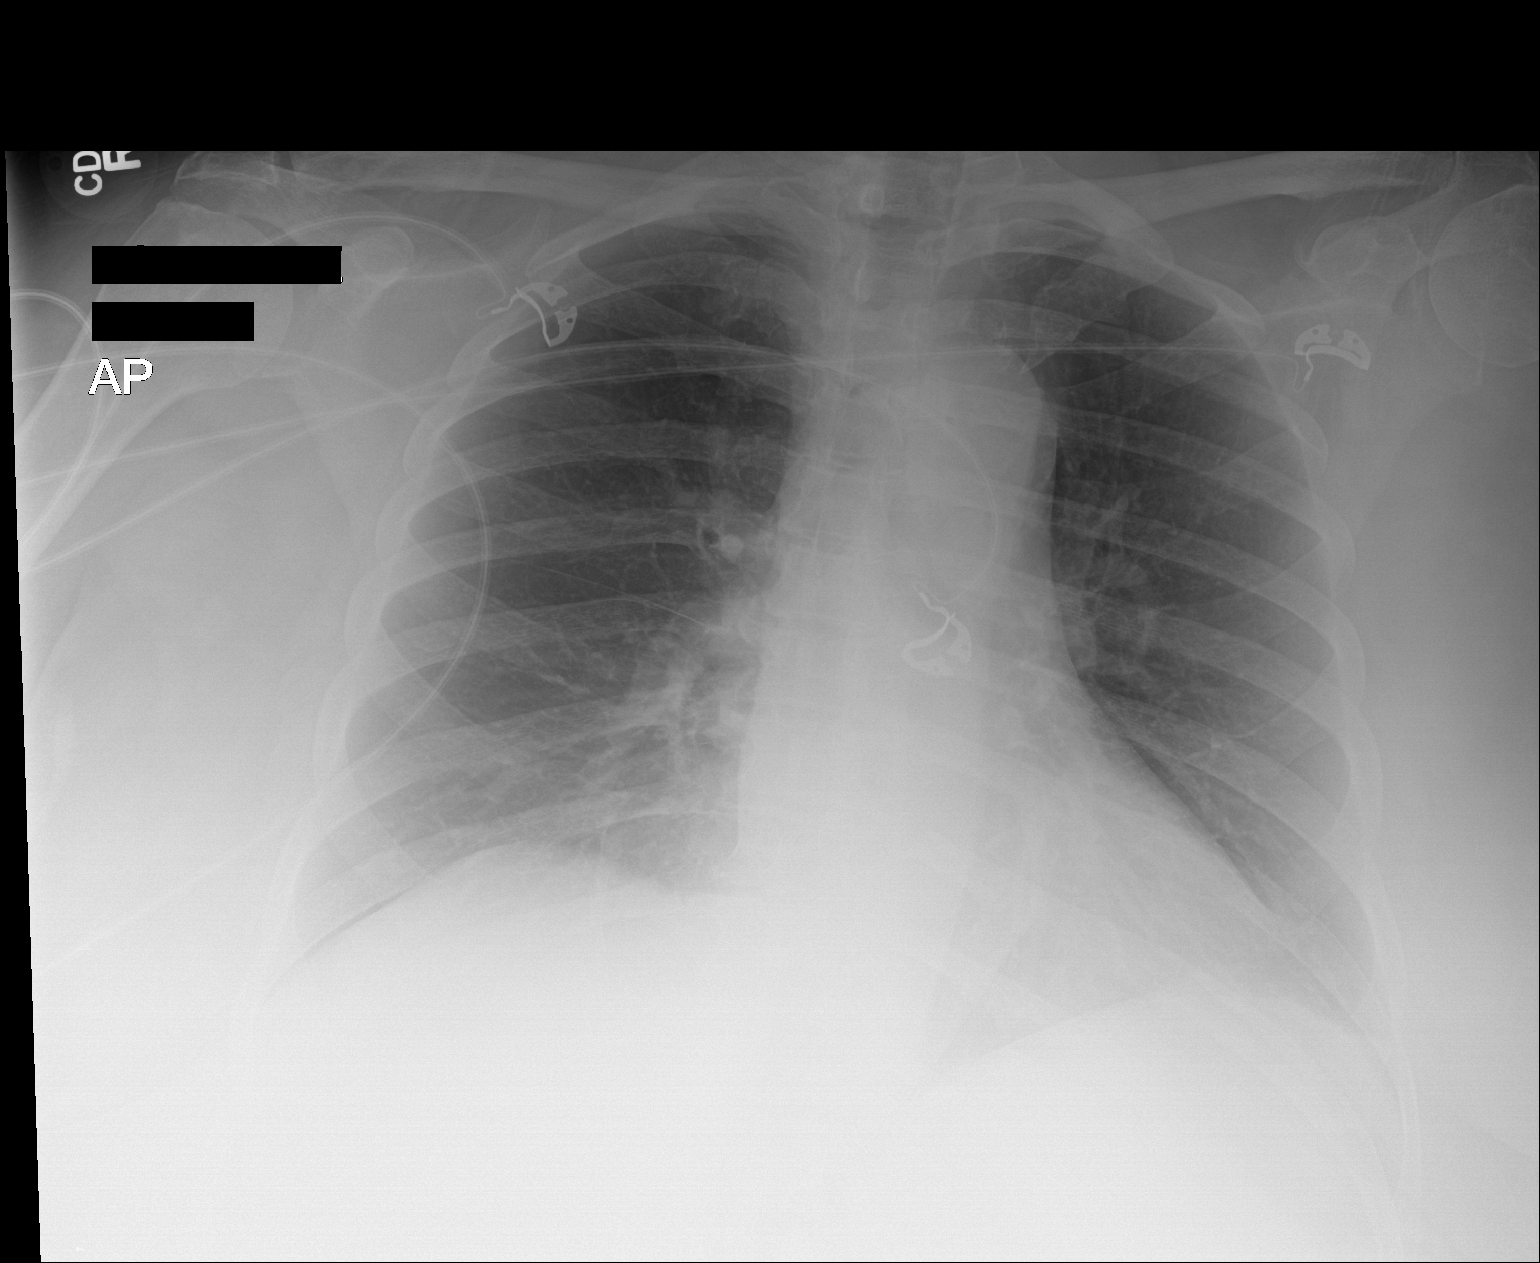

[1 of 1 positions shown; findings below may reference images not displayed]

FINDINGS: The cardiomediastinal contours are normal. The lungs are clear.
Pulmonary vasculature is normal. No consolidation, pleural effusion,
or pneumothorax. No acute osseous abnormalities are seen.
IMPRESSION: No acute chest findings.

## 2024-05-16 ENCOUNTER — Other Ambulatory Visit: Payer: Self-pay

## 2024-05-16 ENCOUNTER — Ambulatory Visit: Payer: PRIVATE HEALTH INSURANCE | Attending: Family Medicine

## 2024-05-16 DIAGNOSIS — M25511 Pain in right shoulder: Secondary | ICD-10-CM | POA: Diagnosis present

## 2024-05-16 DIAGNOSIS — M6281 Muscle weakness (generalized): Secondary | ICD-10-CM | POA: Insufficient documentation

## 2024-05-16 DIAGNOSIS — G8929 Other chronic pain: Secondary | ICD-10-CM | POA: Diagnosis present

## 2024-05-16 DIAGNOSIS — M25562 Pain in left knee: Secondary | ICD-10-CM | POA: Insufficient documentation

## 2024-05-16 DIAGNOSIS — M25561 Pain in right knee: Secondary | ICD-10-CM | POA: Diagnosis present

## 2024-05-16 DIAGNOSIS — M5459 Other low back pain: Secondary | ICD-10-CM | POA: Insufficient documentation

## 2024-05-16 DIAGNOSIS — R2689 Other abnormalities of gait and mobility: Secondary | ICD-10-CM | POA: Diagnosis present

## 2024-05-16 NOTE — Therapy (Addendum)
 OUTPATIENT PHYSICAL THERAPY WHEELCHAIR EVALUATION   Patient Name: Patricia Bradley MRN: [id number] DOB:Mar 16, 1951, 73 y.o., female Today's Date: 05/16/2024  END OF SESSION:    05/16/24 1322  PT Visits / Re-Eval  Visit Number 1  Number of Visits 1  PT Time Calculation  PT Start Time 1015  PT Stop Time 1110  PT Time Calculation (min) 55 min  PT - End of Session  Equipment Utilized During Treatment Gait belt  Activity Tolerance Patient tolerated treatment well  Behavior During Therapy WFL for tasks assessed/performed   Past Medical History:  Diagnosis Date   Diabetes mellitus without complication (HCC)    Hypertension    Renal disorder    No past surgical history on file. There are no active problems to display for this patient.   PCP: [facility]  REFERRING PROVIDER: Vernice Goodell, NP  THERAPY DIAG:  No diagnosis found.  Rationale for Evaluation and Treatment Rehabilitation  SUBJECTIVE:                                                                                                                                                                                           SUBJECTIVE STATEMENT: Pt presents for wheelchair evaluation. PMH significant for OSA on CPAP, emphysema, DM2 with neuropathy and retinopathy, ESRD on HD MWF, and PAD s/p left SFA stent, 4/25 left SFA angioplasty stent, PTA angioplasty and 4/10 Right SFA and PTA angioplasty. Pt goes to dialysis 3 days a week. Pt reports that she has diabetic neuropathy in bil feet and has severly impaired sensation in her feet. Pt had a wound on top of her left foot which took about 1 year to heal completely but during the process, infection spread to her R heel and now she has open wound in her R foot for which is wearing orthotic boot for. Pt reports she has chronic pain in bil knees and lower back which prevents her from standing/walking for couple of minutes. She fell recently and she injured her R shoulder which  makes it difficult for her to reach [facility] or use walker for any prolonged periods of time. Pt also reports of having history of rotator cuff issues on R shoulder.  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, bilateral legs  I70.223 (ICD-10-CM) - Atherosclerosis of native arteries of extremities with rest pain, bilateral legs  I70.248 (ICD-10-CM) - Atherosclerosis of native arteries of left leg with ulceration of other part of lower leg  Z99.3 (ICD-10-CM) - Dependence on wheelchair   [] Progressive disease  Relevant future surgeries:     Height: 5\' 4"  Weight: 227 lbs Explain recent changes or trends in weight:      History:  Past Medical History:  Diagnosis Date   Diabetes mellitus without complication (HCC)    Hypertension    Renal disorder        Cardio Status:  Functional Limitations:   [x] Intact  []  Impaired      Respiratory Status:  Functional Limitations:   [x] Intact  [] Impaired   [] SOB [] COPD [] O2 Dependent ______LPM  [] Ventilator Dependent  Resp equip:                                                     Objective Measure(s):   Orthotics:   [] Amputee:                                                             [] Prosthesis:        HOME ENVIRONMENT:  [] House [] Condo/town home [x] Apartment [] Asst living [] LTCF         [] Own  [] Rent   [x] Lives alone [] Lives with others -  has caregiver 3.5 hours (5 days/week), has family member near by who checks on her every day.                           Hours without assistance: 20.5 hours  [x] Home is  accessible to patient                                 Storage of wheelchair:  [x] In home   [] Other Comments:        COMMUNITY :  TRANSPORTATION:  [] Car [] Gaffer [] Adapted w/c Lift []  Ambulance [] Other:                     [x] Sits in wheelchair during transport   Where is w/c stored during transport?  [] [employer] [place]  []  [employer]  r   [] Self-Driver       Drive while in  Biomedical scientist [] yes [x] no   Employment and/or school:  Specific requirements pertaining to mobility        Other:  COMMUNICATION:  Verbal Communication  [x] WFL [] receptive [] WFL [] expressive [] Understandable  [] Difficult to understand  [] non-communicative  Primary Language:______English________ 2nd:_____________  Communication provided by:[x] Patient [x] Family [x] Caregiver [] Translator   [] Uses an augmentative communication device     Manufacturer/Model :  MOBILITY/BALANCE:  Sitting Balance  Standing Balance  Transfers  Ambulation   [x] WFL      [] WFL  [] Independent  []  Independent   [] Uses UE for balance in sitting Comments:  [] Uses UE/device for stability Comments:  []  Min assist  []  Ambulates independently with       device:___________________      [x]  Mod assist  [x]  Able to ambulate __10____ feet        safely/functionally/independently   []  Min assist  []  Min assist  []  Max assist  []  Non-functional ambulator         History/High risk of falls   []  Mod assist  [x]  Mod assist  []  Dependent  []  Unable to ambulate   []  Max  assist  []  Max assist  Transfer method:[] 1 person [] 2 person [] sliding board [] squat pivot [x] stand pivot [] mechanical patient lift  [] other:   []  Unable  []  Unable    Fall History: # of falls in the past 6 months? 2x # of "near" falls in the past 6 months? Multiple near falls per day. Uses wall, furniture, walker to stabilize with instability    CURRENT SEATING / MOBILITY:  Current Mobility Device: [] None  [] Cane/Walker [] Manual [] Dependent [] Dependent w/ Tilt rScooter  [x] Power (type of control):   Manufacturer: Arvid Birk Model: Evo Serial #:   Size:  Color:  Age:   Purchased by whom: [employer]  Current condition of mobility base:    Current seating system:                         3-4 years                                              Age of seating system:  3-4 years  Describe posture in present seating system:    Is the current mobility meeting medical necessity?:  [] Yes [x] No Describe: Pt reports left arm rest is broken and not stable for her when she is performing transfers, pt reports issues with control module in joy stick, issues with battery life                                    Ability to complete Mobility-Related Activities of Daily Living (MRADL's) with Current Mobility Device:   Move room to room  [] Independent  [] Min [x] Mod [] Max assist  [] Unable  Comments: Pt has to sit down to don/doff clothes due to balance issues; Pt has caregiver assist 5 days/week for 3.5 days a week.  Meal prep  [] Independent  [] Min [] Mod [x] Max assist  [] Unable    Feeding  [x] Independent  [] Min [] Mod [] Max assist  [] Unable    Bathing  [] Independent  [] Min [] Mod [x] Max assist  [] Unable    Grooming  [] Independent  [] Min [x] Mod [] Max assist  [] Unable    UE dressing  [] Independent  [] Min [x] Mod [] Max assist  [] Unable    LE dressing  [] Independent   [] Min [x] Mod [] Max assist  [] Unable    Toileting  [] Independent  [] Min [x] Mod [] Max assist  [] Unable    Bowel Mgt: []  Continent [x]  Incontinent []  Accidents [x]  Diapers []  Colostomy []  Bowel Program:  Bladder Mgt: []  Continent [x]  Incontinent []  Accidents []  Diapers []  Urinal []  Intermittent Cath []  Indwelling Cath []   Supra-pubic Cath     Current Mobility Equipment Trialed/ Ruled Out:    Does not meet mobility needs due to:    Lavonia Powers all boxes that indicate inability to use the specific equipment listed     Meets needs for safe  independent functional   ambulation  / mobility    Risk of  Falling or History of Falls    Enviromental limitations      Cognition    Safety concerns with  physical ability    Decreased / limitations endurance  & strength     Decreased / limitations  motor skills  & coordination    Pain    Pace /  Speed    Cardiac and/or  respiratory condition    Contra - indicated by diagnosis   Cane/Crutches  []    [x]  []   []   [x]   [x]   []   [x]   [x]   []   []    Walker / Rollator  []  NA   []   [x]   []   []   [x]   [x]   []   [x]   [x]   []   []     Manual Wheelchair K0001-K0007:  [x]  NA  []   []   []   []   [x]   [x]   [x]   [x]   [x]   []   []    Manual W/C (K0005) with power assist  [x]  NA  []   []   []   []   []   []   []   []   []   []   []    Scooter  [x]  NA  []   []   []   []   []   []   []   []   []   []   []    Power Wheelchair: standard joystick  []  NA  [x]   []   []   []   []   []   []   []   []   []   []    Power Wheelchair: alternative controls  []  NA  []   []   []   []   []   []   []   []   []   []   []    Summary:  The least costly alternative for independent functional mobility was found to be:    []  Crutch/Cane  []  Walker []  Manual w/c  []  Manual w/c with power assist   []  Scooter   [x]  Power w/c std joystick   []  Power w/c alternative control        []  Requires dependent care mobility device   Cabin crew for [employer] skills are adequate for safe mobility equipment operation  [x]   Yes []   No  Patient is willing and motivated to use recommended mobility equipment  [x]   Yes []   No       []  Patient is unable to safely operate mobility equipment independently and requires dependent care equipment Comments:           SENSATION and SKIN ISSUES:  Sensation [x]  Intact  []  Impaired []  Absent []  Hyposensate []  Hypersensate  []  Defensiveness  Location(s) of impairment:    Pressure Relief Method(s):  [x]  Lean side to side to offload (without risk of falling)  [x]   W/C push up (4+ times/hour for 15+ seconds) [x]  Stand up (without  risk of falling)    []  Other: (Describe): Effective pressure relief method(s) above can be performed consistently throughout the day: [x] Yes  []  No If not, Why?: Skin Integrity Risk:       []  Low risk           []  Moderate risk            [  x] High risk  If high risk, explain:   Skin Issues/Skin Integrity  Current skin Issues  []  Yes [x]  No [x]  Intact  []   Red area   []   Open area  []  Scar tissue  []  At risk from prolonged sitting  Where: History of Skin Issues  [x]  Yes []  No Where : tail bone When: Early in 2025 Stage:stage I-II Hx of skin flap surgeries  []  Yes [x]  No Where:  When:  Pain: [x]  Yes []  No   Pain Location(s): R shoulder, bil knees, lower back Intensity scale: (0-10) : 9/10 How does pain interfere with mobility and/or MRADLs? - difficulty with use of walker due to shoulder pain in R shoulder, diff. With prolonged standing/walking with walker due to back pain, knee pain, wounds on foot and neuropathy in bil feet from DM        MAT EVALUATION:  Neuro-Muscular Status: (Tone, Reflexive, Responses, etc.)     [x]   Intact   []  Spasticity:  []  Hypotonicity  []  Fluctuating  []  Muscle Spasms  []  Poor Righting Reactions/Poor Equilibrium Reactions  []  Primal Reflex(s):    Comments:            COMMENTS:    POSTURE:     Comments:  Pelvis Anterior/Posterior:  [x]  Neutral   []  Posterior  []  Anterior  []  Fixed - No movement []  Tendency away from neutral [x]  Flexible [x]  Self-correction []  External correction Obliquity (viewed from front)  [x]  WFL []  R Obliquity []  L Obliquity  []  Fixed - No movement []  Tendency away from neutral [x]  Flexible [x]  Self-correction []  External correction Rotation  [x]  WFL []  R anterior []  L anterior  []  Fixed - No movement []  Tendency away from neutral [x]  Flexible [x]  Self-correction []  External correction Tonal Influence Pelvis:  []  Normal []  Flaccid []  Low tone []  Spasticity []  Dystonia []  Pelvis  thrust []  Other:    Trunk Anterior/Posterior:  [x]  WFL []  Thoracic kyphosis []  Lumbar lordosis  []  Fixed - No movement []  Tendency away from neutral [x]  Flexible [x]  Self-correction []  External correction  [x]  WFL []  Convex to left  []  Convex to right []  S-curve   []  C-curve []  Multiple curves []  Tendency away from neutral [x]  Flexible [x]  Self-correction []  External correction Rotation of shoulders and upper trunk:  [x]  Neutral []  Left-anterior []  Right- anterior []  Fixed- no movement []  Tendency away from neutral [x]  Flexible [x]  Self correction []  External correction Tonal influence Trunk:  [x]  Normal []  Flaccid []  Low tone []  Spasticity []  Dystonia []  Other:   Head & Neck  [x]  Functional []  Flexed    []  Extended []  Rotated right  []  Rotated left []  Laterally flexed right []  Laterally flexed left []  Cervical hyperextension   [x]  Good head control []  Adequate head control []  Limited head control []  Absent head control Describe tone/movement of head and neck:      Lower Extremity Measurements: LE ROM:  MMT Right 05/16/2024 Left 05/16/2024  Hip flexion 3+ 3+  Hip extension    Hip abduction    Hip adduction    Knee flexion 3+ 3+  Knee extension 4 4  Ankle dorsiflexion 3+ 3+  Ankle plantarflexion     (Blank rows = not tested)  LE MMT:  AROM Right 05/16/2024 Left 05/16/2024  Hip flexion    Hip extension    Hip abduction    Hip adduction    Knee flexion    Knee extension  Ankle dorsiflexion    Ankle plantarflexion     (Blank rows = not tested)  Hip positions:  [x]  Neutral   []  Abducted   []  Adducted  []  Subluxed   []  Dislocated   []  Fixed   []  Tendency away from neutral [x]  Flexible [x]  Self-correction []  External correction   Hip Windswept:[x]  Neutral  []  Right    []  Left  []  Subluxed   []  Dislocated   []  Fixed   []  Tendency away from neutral []  Flexible [x]  Self-correction []  External correction  LE Tone: [x]  Normal []   Low tone []  Spasticity []  Flaccid []  Dystonia []  Rocks/Extends at hip []  Thrust into knee extension []  Pushes legs downward into footrest  Foot positioning: ROM Concerns: Dorsiflexed: []  Right   []  Left Plantar flexed: []  Right    []  Left Inversion: []  Right    []  Left Eversion: []  Right    []  Left  LE Edema: [x]  1+ (Barely detectable impression when finger is pressed into skin) []  2+ (slight indentation. 15 seconds to rebound) []  3+ (deeper indentation. 30 seconds to rebound) []  4+ (>30 seconds to rebound)  UE Measurements:  UPPER EXTREMITY ROM:   Active ROM Right 05/16/2024 Left 05/16/2024  Shoulder flexion 90pain 140 pain  Shoulder abduction 85 pain   Shoulder adduction    Elbow flexion    Elbow extension    Wrist flexion    Wrist extension    (Blank rows = not tested)  UPPER EXTREMITY MMT:  MMT Right 05/16/2024 Left 05/16/2024  Shoulder flexion 3+ pain 4  Shoulder abduction 3+ pain 4  Shoulder adduction    Elbow flexion    Elbow extension    Wrist flexion    Wrist extension    Pinch strength    Grip strength    (Blank rows = not tested)  Shoulder Posture:  Right Tendency towards Left  [x]   Functional [x]    []   Elevation []    []   Depression []    []   Protraction []    []   Retraction []    []   Internal rotation []    []   External rotation []    []   Subluxed []     UE Tone: [x]  Normal []  Flaccid []  Low tone []  Spasticity  []  Dystonia []  Other:   UE Edema: [x]  1+ (Barely detectable impression when finger is pressed into skin) []  2+ (slight indentation. 15 seconds to rebound) []  3+ (deeper indentation. 30 seconds to rebound) []  4+ (>30 seconds to rebound)  Wrist/Hand: Handedness: [x]  Right   []  Left   []  NA: Comments:  Right  Left  [x]   WNL [x]    []   Limitations []    []   Contractures []    []   Fisting []    []   Tremors []    []   Weak grasp []    []   Poor dexterity []    []   Hand movement non functional []    []   Paralysis []         MOBILITY  BASE RECOMMENDATIONS and JUSTIFICATION:  MOBILITY BASE  JUSTIFICATION   Manufacturer:   Games developer:                 [employer]             Color:  Seat Width:   Seat Depth    []  Manual mobility base (continue below)   []  Scooter/POV  [x]  Power mobility base   Number of hours per day spent in above selected mobility base: 8+  Typical daily mobility base use Schedule: during the day to move between rooms to get to bathroom on time, to check her mail in her apartment   [x]  is not a safe, functional ambulator  [x]  limitation prevents from completing a MRADL(s) within a reasonable time frame    [x]  limitation places at high risk of morbidity or mortality secondary to  the attempts to perform a    MRADL(s)  []  limitation prevents accomplishing a MRADL(s) entirely  [x]  provide independent mobility  [x]  equipment is a lifetime medical need  [x]  walker or cane inadequate  [x]  any type manual wheelchair      inadequate  [x]  scooter/POV inadequate      []  requires dependent mobility          MANUAL MOBILITY      []  Standard manual wheelchair  K0001      Arm:    []  both []  right  []  left      Foot:   []  both []  right   []  left  []  self-propels wheelchair  []  will use on regular basis  []  chair fits throughout home  []  willing and motivated to use  []  propels with assistance     []  dependent use   []  Standard hemi-manual wheelchair  K0002      Arm:    []  both []  right  []  left      Foot:   []  both []  right   []  left  []  lower seat height required to foot propel  []  short stature  []  self-propels wheelchair  []  will use on regular basis  []  chair fits throughout home  []  willing and motivated to use   []  propels with assistance  []  dependent use   []  Lightweight manual wheelchair  K0003      Arm:    []  both []  right  []  left      Foot:   []  both  []  right  []  left                   []  hemi height required  []  medical condition and weight of  wheelchair affect ability to self       propel standard manual wheelchair in the residence  []  can and does self-propel (marginal propulsion skills)  []  daily use _________hours  []  chair fits throughout home  []  willing and motivated to use  []  lower seat height required to foot propel  []  short stature   []  High strength lightweight manual  wheelchair (Breezy Ultra 4)  K0004     Arm:    []  both []  right  []  left     Foot:   []  both []  right   []  left                                                                  []  hemi height required []  medical condition and weight of wheelchair affect ability to self propel while engaging in frequent MRADL(s) that cannot be performed in a standard or lightweight manual wheelchair  []  daily use _________hours  []  chair fits throughout home  []  willing and motivated to use  []  prevent repetitive use injuries   []  lower seat  height required to foot propel  []  short stature    []  Ultra-lightweight manual wheelchair  K0005     Arm:    []  both []  right  []  left     Foot:   []  both []  right  []  left       []  hemi height required  []  heavy duty    Front seat to floor _____ inches      Rear seat to floor _____ inches      Back height _____ inches     Back angle ______ degrees      Front angle _____ degrees  []   full-time manual wheelchair user  []  Requires individualized fitting and optimal adjustments for multiple features that include adjustable axle configuration, fully adjustable center of gravity, wheel camber, seat and back angle, angle of seat slope, which cannot be accommodated by a K0001 through K0004 manual wheelchair  []  prevent repetitive use injuries  []  daily use_________hours   []  user has high activity patterns that frequently require  them  to go out into the community for the purpose of independently accomplishing high level MRADL activities. Examples of these might include a combination of; shopping, work, school, Photographer, childcare, independently loading and  unloading from a vehicle etc.  []  lower seat height required to foot propel  []  short stature  []  heavy duty -  weight over 250lbs   []  Current chair is a K0005   manufacture:___________________  model:_________________  serial#____________________  age:_________    []  First time Z6109 user (complete trial)  K0004 time and # of strokes to propel 30 feet: ________seconds _________strokes  U0454 time and # of strokes to propel 30 feet: ________seconds _________strokes  What was the result of the trial between the K0004 and K0005 manual wheelchair? ___    What features of the K0005 w/c are needed as compared to the K0004 base? Why?___    []  adjustable seat and back angle changes the angle of seat slope of the frame to attain a gravity assisted position for efficient propulsion and proper weight distribution along the frame     []  the front of the wheelchair will be configured higher than the back of the chair to allow gravity to assist the user with postural stability  []  the center of the wheel will be positioned for stability, safety and efficient propulsion  []  adjustable axle allows for vertical, horizontal, camber and overall width changes  throughout the wheels for adjustment of the client's exact needs and abilities.   []  adjustable axle increases the stability and function of the chair allowing for adjustment of the center of gravity.   []  accommodates the client's anatomical position in the chair maximizing independence in mobility and maneuverability in all environments.   []  create a minimal fixed tilt-in space to assist in positioning.   []  Describe users full-time manual wheelchair activity patterns:___    []  Power assist Comments:  []  prevent repetitive use injuries  []  repetitive strain injury present in    shoulder girdle    []  shoulder pain is (> or =) to 7/10     during manual propulsion       Current Pain _____/10  []  requires conservation of energy to participate  in MRADL(s) runable to propel up ramps or curbs using manual wheelchair  []  been K0005 user greater than one year  []  user unwilling to use power      wheelchair (reason): []  less expensive option to power  wheelchair   []  rim activated power assist -      decreased strength   []  Heavy duty manual wheelchair       K0006     Arm:    []  both []  right  []  left     Foot:   []  both []  right  []  left     []  hemi height required    []  Dependent base  []  user exceeds 250lbs  []  non-functional ambulator    []  extreme spasticity  []  over active movement   []  broken frame/hx of repeated     repairs  []  able to self-propel in residence       []  lower seat to floor height required  []  unable to self-propel in residence   []  Extra heavy duty manual wheelchair  K0007     Arm:    []  both []  right  []  left     Foot:   []  both []  right  []  left     []  hemi height required  []  Dependent base  []  user exceeds 300lbs  []  non-functional ambulator    []  able to self-propel in residence   []  lower seat to floor height required  []  unable to self-propel in residence     []  Manual wheelchair with tilt [phone]      (Manual "Tilt-n-Space")  []  patient is dependent for transfers  []  patient requires frequent       positioning for pressure relief   []  patient requires frequent      positioning for poor/absent trunk control        []  Stroller Base  []  infant/child   []  unable to propel manual      wheelchair  []  allows for growth  []  non-functional ambulator  []  non-functional UE  []  independent mobility is not a goal at this time    MANUAL FRAME OPTIONS      Push handles  []  extended   []  angle adjustable   []  standard  []  caregiver access  []  caregiver assist    []  allows "hooking" to enable      increased ability to perform ADLs or maintain balance   []  Angle Adjustable Back  []  postural control  []  control of tone/spasticity  []  accommodation of range of motion  []  UE functional control  []   accommodation for seating system    Rear wheel placement  []  std/fixed  [] fully adjustableramputee   []  camber ________degree  []  removable rear wheel  []  non-removable rear wheel  Wheel size _______  Wheel style_______________________  []  improved UE access to wheels  []  increase propulsion ability  []  improved stability  []  changing angle in space for      improvement of postural stability  []  remove for transport    []  allow for seating system to fit on  base  []  amputee placement  []  1-arm drive access   r R  r L  []  enable propulsion of manual       wheelchair with one arm    []  amputee placement   Wheel rims/ Hand rims  []  Standard    []  Specialized-____ []  provide ability to propel manual   []  increase self-propulsion with hand wheelchair weakness/decreased grasp     []  Spoke protector/guard   []  prevent hands from getting caught in spokes   Tires:  []  pneumatic  []  flat free inserts  []  solid  Style:  []  decrease roll resistance              []   prevent frequent flats  []  increase shock absorbency  []  decrease maintenance   []  decrease pain from road shock    []  decrease spasms from road shock    Wheel Locks:    []  push []  pull []  scissor  []  lock wheels for transfers  []  lock wheels from rolling   Brake/wheel lock extension:  []  R  []  L  []  allow user to operate wheel locks due to decreased reach or strength   Caster housing:  Caster size:                      Style:                                          []  suspension fork  []  maneuverability   []  stability of wheelchair   []  durability  []  maintenance  []  angle adjustment for posture  []  allow for feet to come under        wheelchair base  []  allows change in seat to floor  height   []  increase shock absorbency  []  decrease pain from road shock  []  decrease spasms from road    shock   []  Side guards  []  prevent clothing getting caught in wheel or becoming soiled   [] provide hip and pelvic stability  []   eliminates contact between body and wheels  []  limit hand contact with wheels   []  Anti-tippers      []  prevent wheelchair from tipping    backward  []  assist caregiver with curbs     POWER MOBILITY      []  Scooter/POV    []  can safely operate   []  can safely transfer   []  has adequate trunk stability   []  cannot functionally propel  manual wheelchair    [x]  Power mobility base    []  non-ambulatory   [x]  cannot functionally propel manual wheelchair   [x]  cannot functionally and safely      operate scooter/POV  [x]  can safely operate power       wheelchair  [x]  home is accessible  [x]  willing to use power wheelchair     Tilt  []  Powered tilt on powered chair  []  Powered tilt on manual chair  []  Manual tilt on manual chair Comments:  []  change position for pressure      []  elief/cannot weight shift   []  change position against      gravitational force on head and      shoulders   []  decrease pain  []  blood pressure management   []  control autonomic dysreflexia  []  decrease respiratory distress  []  management of spasticity  []  management of low tone  []  facilitate postural control   []  rest periods   []  control edema  []  increase sitting tolerance   []  aid with transfers     Recline   []  Power recline on power chair  []  Manual recline on manual chair  Comments:    []  intermittent catheterization  []  manage spasticity  []  accommodate femur to back angle  []  change position for pressure relief/cannot weight shift rhigh risk of pressure sore development  []  tilt alone does not accomplish     effective pressure relief, maximum pressure relief achieved at -      _______ degrees tilt   _______ degrees recline   []   difficult to transfer to and from bed []  rest periods and sleeping in chair  []  repositioning for transfers  []  bring to full recline for ADL care  []  clothing/diaper changes in chair  []  gravity PEG tube feeding  []  head positioning  []  decrease pain  []  blood  pressure management   []  control autonomic dysreflexia  []  decrease respiratory distress  []  user on ventilator     Elevator on mobility base  []  Power wheelchair  []  Scooter  []  increase Indep in transfers   []  increase Indep in ADLs    []  bathroom function and safety  []  kitchen/cooking function and safety  []  shopping  []  raise height for communication at standing level  []  raise height for eye contact which reduces cervical neck strain and pain  []  drive at raised height for safety and navigating crowds  []  Other:   []  Vertical position system  (anterior tilt)     (Drive locks-out)    []  Stand       (Drive enabled)  []  independent weight bearing  []  decrease joint contractures  []  decrease/manage spasticity  []  decrease/manage spasms  []  pressure distribution away from   scapula, sacrum, coccyx, and ischial tuberosity  []  increase digestion and elimination   []  access to counters and cabinets  []  increase reach  []  increase interaction with others at eye level, reduces neck strain  []  increase performance of       MRADL(s)      Power elevating legrest    []  Center mount (Single) 85-170 degrees       []  Standard (Pair) 100-170 degrees  []  position legs at 90 degrees, not available with std power ELR  []  center mount tucks into chair to decrease turning radius in home, not available with std power ELR  []  provide change in position for LE  []  elevate legs during recline    []  maintain placement of feet on      footplate  []  decrease edema  []  improve circulation  []  actuator needed to elevate legrest  []  actuator needed to articulate legrest preventing knees from flexing  []  Increase ground clearance over      curbs  []   STD (pair) independently                     elevate legrest   POWER WHEELCHAIR CONTROLS      Controls/input device  []  Expandable  []  Non-expandable  []  Proportional  []  Right Hand []  Left Hand  []  Non-proportional/switches/head-array  []   Electrical/proximity         []   Mechanical      Manufacturer:___________________   Type:________________________ []  provides access for controlling wheelchair  []  programming for accurate control  []  progressive disease/changing condition  []  required for alternative drive      controls       []  lacks motor control to operate  proportional drive control  []  unable to understand proportional controls  []  limited movement/strength  []  extraneous movement / tremors / ataxic / spastic       []  Upgraded electronics controller/harness    []  Single power (tilt or recline)   []  Expandable    []  Non-expandable plus   []  Multi-power (tilt, recline, power legrest, power seat lift, vertical positioning system, stand)  []  allows input device to communicate with drive motors  []  harness provides necessary connections between the controller, input device, and seat functions     []   needed in order to operate power seat functions through joystick/ input device  []  required for alternative drive controls     []  Enhanced display  []  required to connect all alternative drive controls   []  required for upgraded joystick      (lite-throw, heavy duty, micro)  []  Allows user to see in which mode and drive the wheelchair is set; necessary for alternate controls       []  Upgraded tracking electronics  []  correct tracking when on uneven surfaces makes switch driving more efficient and less fatiguing  []  increase safety when driving  []  increase ability to traverse thresholds    []  Safety / reset / mode switches     Type:    []  Used to change modes and stop the wheelchair when driving     []  Mount for joystick / input device/switches  []  swing away for access or transfers   []  attaches joystick / input device / switches to wheelchair   []  provides for consistent access  []  midline for optimal placement    []  Attendant controlled joystick plus     mount  []  safety  []  long distance driving  []  operation  of seat functions  []  compliance with transportation regulations    [x]  Battery x 2 [x]  required to power (power assist / scooter/ power wc / other):   []  Power inverter (24V to 12V)  []  required for ventilator / respiratory equipment / other:     CHAIR OPTIONS MANUAL & POWER      Armrests   []  adjustable height []  removable  []  swing away []  fixed  [x]  flip back  []  reclining  [x]  full length pads []  desk []  tube arms []  gel pads  [x]  provide support with elbow at 90    [x]  remove/flip back/swing away for  transfers  [x]  provide support and positioning of upper body    [x]  allow to come closer to table top  []  remove for access to tables  []  provide support for w/c tray  []  change of height/angles for variable activities   []  Elbow support / Elbow stop  []  keep elbow positioned on arm pad  []  keep arms from falling off arm pad  during tilt and/or recline   Upper Extremity Support  []  Arm trough  []   R  []   L  Style:  []  swivel mount []  fixed mount   []  posterior hand support  []   tray  []  full tray  []  joystick cut out  []   R  []   L  Style:  []  decrease gravitational pull on      shoulders  []  provide support to increase UE  function  []  provide hand support in natural    position  []  position flaccid UE  []  decrease subluxation    []  decrease edema       []  manage spasticity   []  provide midline positioning  []  provide work surface  []  placement for AAC/ Computer/ EADL       Hangers/ Legrests   []  ______ degree  []  Elevating []  articulating  []  swing away []  fixed []  lift off  []  heavy duty  []  adjustable knee angle  []  adjustable calf panel   []  longer extension tube              []  provide LE support  []  maintain placement of feet on      footplate   []   accommodate lower leg length  []  accommodate to hamstring       tightness  []  enable transfers  []  provide change in position for LE's  []  elevate legs during recline    []  decrease edema  []  durability       Foot support   []  footplate []  R []  L []  flip up           []  Depth adjustable   []  angle adjustable  []  foot board/one piece    []  provide foot support  []  accommodate to ankle ROM  []  allow foot to go under wheelchair base  []  enable transfers     []  Shoe holders  []  position foot    []  decrease / manage spasticity  []  control position of LE  []  stability    []  safety     []  Ankle strap/heel      loops  []  support foot on foot support  []  decrease extraneous movement  []  provide input to heel   []  protect foot     []  Amputee adapter []  R  []  L     Style:                  Size:  []  Provide support for stump/residual extremity    []  Transportation tie-down  []  to provide crash tested tie-down brackets    []  Crutch/cane holder    []  O2 holder    []  IV hanger   []  Ventilator tray/mount    []  stabilize accessory on wheelchair       Component  Justification     []  Seat cushion      []  accommodate impaired sensation  []  decubitus ulcers present or history  []  unable to shift weight  []  increase pressure distribution  []  prevent pelvic extension  []  custom required "off-the-shelf"    seat cushion will not accommodate deformity  []  stabilize/promote pelvis alignment  []  stabilize/promote femur alignment  []  accommodate obliquity  []  accommodate multiple deformity  []  incontinent/accidents  []  low maintenance     []  seat mounts                 []  fixed []  removable  []  attach seat platform/cushion to wheelchair frame    []  Seat wedge    []  provide increased aggressiveness of seat shape to decrease sliding  down in the seat  []  accommodate ROM        []  Cover replacement   []  protect back or seat cushion  []  incontinent/accidents    []  Solid seat / insert    []  support cushion to prevent      hammocking  []  allows attachment of cushion to mobility base    []  Lateral pelvic/thigh/hip     support (Guides)     []  decrease abduction  []  accommodate pelvis  []  position upper  legs  []  accommodate spasticity  []  removable for transfers     []  Lateral pelvic/thigh      supports mounts  []  fixed   []  swing-away   []  removable  []  mounts lateral pelvic/thigh supports     []  mounts lateral pelvic/thigh supports swing-away or removable for transfers    []  Medial thigh support ([place]mel)  [] decrease adduction  [] accommodate ROM  []  remove for transfers   []  alignment      []  Medial thigh   []  fixed      support mounts      []   swing-away   []  removable  []  mounts medial thigh supports   []  Mounts medial supports swing- away or removable for transfers       Component  Justification   []  Back       []  provide posterior trunk support []  facilitate tone  []  provide lumbar/sacral support []  accommodate deformity  []  support trunk in midline   []  custom required "off-the-shelf" back support will not accommodate deformity   []  provide lateral trunk support []  accommodate or decrease tone            []  Back mounts  []  fixed  []  removable  []  attach back rest/cushion to wheelchair frame   []  Lateral trunk      supports  []  R []  L  []  decrease lateral trunk leaning  []  accommodate asymmetry    []  contour for increased contact  []  safety    []  control of tone    []  Lateral trunk      supports mounts  []  fixed  []  swing-away   []  removable  []  mounts lateral trunk supports     []  Mounts lateral trunk supports swing-away or removable for transfers   []  Anterior chest      strap, vest     []  decrease forward movement of shoulder  []  decrease forward movement of trunk  []  safety/stability  []  added abdominal support  []  trunk alignment  []  assistance with shoulder control   []  decrease shoulder elevation    []  Headrest      []  provide posterior head support  []  provide posterior neck support  []  provide lateral head support  []  provide anterior head support  []  support during tilt and recline  []  improve feeding     []  improve respiration  []  placement of switches   []  safety    []  accommodate ROM   []  accommodate tone  []  improve visual orientation   []  Headrest           []  fixed []  removable []  flip down      Mounting hardware   []  swing-away laterals/switches  []  mount headrest   []  mounts headrest flip down or  removable for transfers  []  mount headrest swing-away laterals   []  mount switches     []  Neck Support    []  decrease neck rotation  []  decrease forward neck flexion   Pelvic Positioner    [x]  std hip belt          []  padded hip belt  []  dual pull hip belt  []  four point hip belt  []  stabilize tone  [x]  decrease falling out of chair  []  prevent excessive extension  []  special pull angle to control      rotation  []  pad for protection over boney   prominence  []  promote comfort    []  Essential needs        bag/pouch   []  medicines []  special food rorthotics []  clothing changes  []  diapers  []  catheter/hygiene []  ostomy supplies   The above equipment has a life- long use expectancy.  Growth and changes in medical and/or functional conditions would be the exceptions.   SUMMARY:    ASSESSMENT:  Functional Test: TUG: 38 sec with RW  CLINICAL IMPRESSION: Patient is a 73 y.o. female who was seen today for physical therapy evaluation and treatment for power mobility assessment. PMH includes PMH significant for OSA on CPAP, emphysema, DM2 with neuropathy and  retinopathy, ESRD on HD MWF, and PAD s/p left SFA stent, 4/25 left SFA angioplasty stent, PTA angioplasty and 4/10 Right SFA and PTA angioplasty, chronic DJD of bil knees, lumbar spine, chronic shoulder pain, and current open wound in R foot. Patient currently lives alone but has family members close by and has caregiver assistance for 3.5 hours a day for 5 days a week who provides necessary assistance. Patient currently is using group 2 power chair but due it is currently not meeting medical necessity as one of the handle bar is broken and batteries are not holding charge per patient.  Per patient, vendor is unable to provide repairs due to insurance restrictions. Patient will benefit from replacement of Group 2 power wheelchair to improve safe transfers, continue to prevent safe household mobility for patient, and continue to preserve current level of independence.    OBJECTIVE IMPAIRMENTS Abnormal gait, decreased activity tolerance, decreased balance, decreased endurance, decreased mobility, difficulty walking, decreased ROM, decreased strength, impaired flexibility, impaired sensation, impaired UE functional use, and pain.   ACTIVITY LIMITATIONS carrying, lifting, bending, standing, squatting, stairs, transfers, continence, bathing, dressing, reach over head, and caring for others  PARTICIPATION LIMITATIONS: meal prep, cleaning, laundry, driving, shopping, and community activity  PERSONAL FACTORS Age, Past/current experiences, Time since onset of injury/illness/exacerbation, and 3+ comorbidities: open wound, DM, neuropathy, chronic DJD, shoulder pain, LBP, knee pain are also affecting patient's functional outcome.   REHAB POTENTIAL: Good  CLINICAL DECISION MAKING: Stable/uncomplicated  EVALUATION COMPLEXITY: High  Patient was provided education on using RW safely at home and never attempt to ambulate or transfer without using her walker to improve safety and reduce risks of falls. Pt was educated on not using rolator due to safety concerns but use 2 wheeled walker for safe transfers and short distance ambulations.                                   GOALS: One time visit. No goals established.    PLAN: PT FREQUENCY: one time visit    Kristine Phalen, PT 05/16/2024, 9:25 AM    I concur with the above findings and recommendations of the therapist:  Physician name printed:         Physician's signature:      Date:

## 2024-07-11 NOTE — ED Provider Notes (Signed)
 Patient placed in First Look pathway, seen and evaluated for chief complaint of mechanical ground level fall. Fell at home, struck front of head. On Plavix. Currently endorses HA. No other areas of pain. Pertinent exam findings include hematoma to left frontal scalp, awake, no acute distress.   Patient/parent counseled on process, plan, and necessity for staying for completing the evaluation.      Note By: Metta Lines, PA-C 5:17 PM    .[facility] Emergency Department Emergency Department Provider Note  This document was created using the aid of voice recognition Dragon dictation software.   History obtained from the: Patient  History   Chief Complaint  Patient presents with  . Fall    HPI  Patricia Bradley is a 73 y.o. female who presents to the ED with complaints of see above.    No LMP recorded. Patient is postmenopausal.  Past Medical History Medical History[1]  Past Surgical History Surgical History[2]  Medications These were reviewed. See nursing note for details.  Allergies Tramadol, Penicillins, Adhesive, Pregabalin, Sodium ferric gluconat-sucrose, Aspirin, Fentanyl, Hydrocodone-acetaminophen , Iodinated contrast media, Memantine, and Metformin  Family History Family History[3]  Social History Social History[4]  Review of Systems  Review of Systems  Physical Exam   Vitals:   07/11/24 1722 07/11/24 1824  BP: (!) 175/91 98/65  BP Location: Left arm Left arm  Patient Position: Sitting Sitting  Pulse: 74 78  Resp: 17 18  Temp: 97.9 F (36.6 C) 98.2 F (36.8 C)  TempSrc: Oral Oral  SpO2: 99% 95%  Weight: 121 kg (266 lb 12.1 oz)   Height: 162.6 cm (5' 4)     Physical Exam Vitals signs and nursing note reviewed.  Constitutional:      General: Patient is not in acute distress. HENT:     Head: Normocephalic  Pulmonary:     Effort: Pulmonary effort is normal.  Psychiatric:        Behavior: Behavior normal.   Results   LABS Lab Results (last 24 hours)     ** No results found for the last 24 hours. **       Radiology   Radiology Results (last 72 hours)     Procedure Component Value Units Date/Time   CT Head WO Contrast [[id number]] Collected: 07/11/24 1728   Order Status: Completed Updated: 07/11/24 1742   Narrative:     CLINICAL DATA:  Neck trauma (Age >= 65y); Head trauma, coagulopathy (Age 19-64y); fall on thinners  EXAM: CT HEAD WITHOUT CONTRAST  CT MAXILLOFACIAL WITHOUT CONTRAST  CT CERVICAL SPINE WITHOUT CONTRAST  TECHNIQUE: Multidetector CT imaging of the head, cervical spine, and maxillofacial structures were performed using the standard protocol without intravenous contrast. Multiplanar CT image reconstructions of the cervical spine and maxillofacial structures were also generated.  RADIATION DOSE REDUCTION: This exam was performed according to the departmental dose-optimization program which includes automated exposure control, adjustment of the mA and/or kV according to patient size and/or use of iterative reconstruction technique.  COMPARISON:  CT max face 12/19/2020  FINDINGS: CT HEAD FINDINGS  Brain:  Patchy and confluent areas of decreased attenuation are noted throughout the deep and periventricular white matter of the cerebral hemispheres bilaterally, compatible with chronic microvascular ischemic disease. Chronic right occipital lobe encephalomalacia. No evidence of large-territorial acute infarction. No parenchymal hemorrhage. No mass lesion. No extra-axial collection.  No mass effect or midline shift. No hydrocephalus. Basilar cisterns are patent. Partial empty sella.  Vascular: No hyperdense vessel. Atherosclerotic calcifications are  present within the cavernous internal carotid and vertebral arteries.  Skull: No acute fracture or focal lesion.  Other: Acute 1.6 cm left frontal scalp hematoma formation.  CT MAXILLOFACIAL FINDINGS  Osseous: No  fracture or mandibular dislocation. No destructive process. Chronic tooth fragment along the left mandible (3:58).  Sinuses/Orbits: Right ethmoid sinus osteoma. Paranasal sinuses and mastoid air cells are clear. Bilateral lens replacement. Otherwise the orbits are unremarkable.  Soft tissues: Left mandibular subcutaneus soft tissue hematoma (2:82).  CT CERVICAL SPINE FINDINGS  Alignment: Normal.  Skull base and vertebrae: Multilevel moderate degenerative changes spine most prominent at the C5-C6 level. No associated severe osseous neural foraminal or central canal stenosis. No acute fracture. No aggressive appearing focal osseous lesion or focal pathologic process.  Soft tissues and spinal canal: No prevertebral fluid or swelling. No visible canal hematoma.  Upper chest: Unremarkable.  Other: Atherosclerotic plaque of the aorta arch and its main branches. Retropharyngeal course of the carotid arteries within the neck. 1.5 cm right thyroid gland hypodensity with associated calcification.    Impression:     1. No acute intracranial abnormality. 2. No acute displaced facial fracture. 3. No acute displaced fracture or traumatic listhesis of the cervical spine. 4. A 1.5 cm right thyroid gland hypodensity with associated calcification. Recommend thyroid US  (ref: J Am Coll Radiol. 2015 Feb;12(2): 143-50). 5.  Aortic Atherosclerosis (ICD10-I70.0). 6. Partial empty sella. Findings is often a normal anatomic variant but can be associated with idiopathic intracranial hypertension (pseudotumor cerebri).   Electronically Signed   By: Morgane  Naveau M.D.   On: 07/11/2024 17:39    CT Spine Cervical WO Contrast [[id number]] Collected: 07/11/24 1728   Order Status: Completed Updated: 07/11/24 1742   Narrative:     CLINICAL DATA:  Neck trauma (Age >= 65y); Head trauma, coagulopathy (Age 74-64y); fall on thinners  EXAM: CT HEAD WITHOUT CONTRAST  CT MAXILLOFACIAL WITHOUT  CONTRAST  CT CERVICAL SPINE WITHOUT CONTRAST  TECHNIQUE: Multidetector CT imaging of the head, cervical spine, and maxillofacial structures were performed using the standard protocol without intravenous contrast. Multiplanar CT image reconstructions of the cervical spine and maxillofacial structures were also generated.  RADIATION DOSE REDUCTION: This exam was performed according to the departmental dose-optimization program which includes automated exposure control, adjustment of the mA and/or kV according to patient size and/or use of iterative reconstruction technique.  COMPARISON:  CT max face 12/19/2020  FINDINGS: CT HEAD FINDINGS  Brain:  Patchy and confluent areas of decreased attenuation are noted throughout the deep and periventricular white matter of the cerebral hemispheres bilaterally, compatible with chronic microvascular ischemic disease. Chronic right occipital lobe encephalomalacia. No evidence of large-territorial acute infarction. No parenchymal hemorrhage. No mass lesion. No extra-axial collection.  No mass effect or midline shift. No hydrocephalus. Basilar cisterns are patent. Partial empty sella.  Vascular: No hyperdense vessel. Atherosclerotic calcifications are present within the cavernous internal carotid and vertebral arteries.  Skull: No acute fracture or focal lesion.  Other: Acute 1.6 cm left frontal scalp hematoma formation.  CT MAXILLOFACIAL FINDINGS  Osseous: No fracture or mandibular dislocation. No destructive process. Chronic tooth fragment along the left mandible (3:58).  Sinuses/Orbits: Right ethmoid sinus osteoma. Paranasal sinuses and mastoid air cells are clear. Bilateral lens replacement. Otherwise the orbits are unremarkable.  Soft tissues: Left mandibular subcutaneus soft tissue hematoma (2:82).  CT CERVICAL SPINE FINDINGS  Alignment: Normal.  Skull base and vertebrae: Multilevel moderate degenerative changes spine  most prominent at the C5-C6 level. No associated severe  osseous neural foraminal or central canal stenosis. No acute fracture. No aggressive appearing focal osseous lesion or focal pathologic process.  Soft tissues and spinal canal: No prevertebral fluid or swelling. No visible canal hematoma.  Upper chest: Unremarkable.  Other: Atherosclerotic plaque of the aorta arch and its main branches. Retropharyngeal course of the carotid arteries within the neck. 1.5 cm right thyroid gland hypodensity with associated calcification.    Impression:     1. No acute intracranial abnormality. 2. No acute displaced facial fracture. 3. No acute displaced fracture or traumatic listhesis of the cervical spine. 4. A 1.5 cm right thyroid gland hypodensity with associated calcification. Recommend thyroid US  (ref: J Am Coll Radiol. 2015 Feb;12(2): 143-50). 5.  Aortic Atherosclerosis (ICD10-I70.0). 6. Partial empty sella. Findings is often a normal anatomic variant but can be associated with idiopathic intracranial hypertension (pseudotumor cerebri).   Electronically Signed   By: Morgane  Naveau M.D.   On: 07/11/2024 17:39    CT Facial Bones WO Contrast [[id number]] Collected: 07/11/24 1728   Order Status: Completed Updated: 07/11/24 1742   Narrative:     CLINICAL DATA:  Neck trauma (Age >= 65y); Head trauma, coagulopathy (Age 85-64y); fall on thinners  EXAM: CT HEAD WITHOUT CONTRAST  CT MAXILLOFACIAL WITHOUT CONTRAST  CT CERVICAL SPINE WITHOUT CONTRAST  TECHNIQUE: Multidetector CT imaging of the head, cervical spine, and maxillofacial structures were performed using the standard protocol without intravenous contrast. Multiplanar CT image reconstructions of the cervical spine and maxillofacial structures were also generated.  RADIATION DOSE REDUCTION: This exam was performed according to the departmental dose-optimization program which includes automated exposure control, adjustment  of the mA and/or kV according to patient size and/or use of iterative reconstruction technique.  COMPARISON:  CT max face 12/19/2020  FINDINGS: CT HEAD FINDINGS  Brain:  Patchy and confluent areas of decreased attenuation are noted throughout the deep and periventricular white matter of the cerebral hemispheres bilaterally, compatible with chronic microvascular ischemic disease. Chronic right occipital lobe encephalomalacia. No evidence of large-territorial acute infarction. No parenchymal hemorrhage. No mass lesion. No extra-axial collection.  No mass effect or midline shift. No hydrocephalus. Basilar cisterns are patent. Partial empty sella.  Vascular: No hyperdense vessel. Atherosclerotic calcifications are present within the cavernous internal carotid and vertebral arteries.  Skull: No acute fracture or focal lesion.  Other: Acute 1.6 cm left frontal scalp hematoma formation.  CT MAXILLOFACIAL FINDINGS  Osseous: No fracture or mandibular dislocation. No destructive process. Chronic tooth fragment along the left mandible (3:58).  Sinuses/Orbits: Right ethmoid sinus osteoma. Paranasal sinuses and mastoid air cells are clear. Bilateral lens replacement. Otherwise the orbits are unremarkable.  Soft tissues: Left mandibular subcutaneus soft tissue hematoma (2:82).  CT CERVICAL SPINE FINDINGS  Alignment: Normal.  Skull base and vertebrae: Multilevel moderate degenerative changes spine most prominent at the C5-C6 level. No associated severe osseous neural foraminal or central canal stenosis. No acute fracture. No aggressive appearing focal osseous lesion or focal pathologic process.  Soft tissues and spinal canal: No prevertebral fluid or swelling. No visible canal hematoma.  Upper chest: Unremarkable.  Other: Atherosclerotic plaque of the aorta arch and its main branches. Retropharyngeal course of the carotid arteries within the neck. 1.5 cm right thyroid gland  hypodensity with associated calcification.    Impression:     1. No acute intracranial abnormality. 2. No acute displaced facial fracture. 3. No acute displaced fracture or traumatic listhesis of the cervical spine. 4. A 1.5 cm right thyroid gland hypodensity with  associated calcification. Recommend thyroid US  (ref: J Am Coll Radiol. 2015 Feb;12(2): 143-50). 5.  Aortic Atherosclerosis (ICD10-I70.0). 6. Partial empty sella. Findings is often a normal anatomic variant but can be associated with idiopathic intracranial hypertension (pseudotumor cerebri).   Electronically Signed   By: Morgane  Naveau M.D.   On: 07/11/2024 17:39        ED Course  Patient counseled on process, plan, and necessity for staying for completing the evaluation. Encouraged to stay. Patient's labs and imagining were ordered as appropriate for a provider and triage setting, further evaluation and treatment could have became necessary if the patient were to have stayed for further evaluation or waited for results to have been reviewed with the patient.   Medications Given in Emergency Department   Medications  acetaminophen  (TYLENOL ) tablet 1,000 mg (1,000 mg oral Given 07/11/24 1853)    Medical Decision Making  Pt is a 73 y.o. female who presented with ground level fall.  Vital signs reviewed and patient was afebrile and hemodynamically stable. On initial evaluation, imaging obtained including CT head, C-spine and facial bones. Patient eloped from emergency department prior to reevaluation from provider and discussion of lab results, imaging, diagnoses and plan of care. Last seen, patient remained stable and in no acute distress. Alan Oas, RN and Resource Nurse will follow up with patient regarding lab results and will be notified to return to ED for new or worsening symptoms, or follow-up with PCP. Plan is patient eloped prior to complete evaluation.    ED Clinical Impression   1. Ground-level fall            [1] Past Medical History: Diagnosis Date  . AKI (acute kidney injury)   . Anemia   . Anxiety   . Arthritis   . Asthma (CMD)   . Benign hypertension with CKD (chronic kidney disease) stage V (HCC) 08/29/2019  . Chronic ulcer of left foot, with fat layer exposed    (CMD) 05/26/2023  . Colon polyp   . COPD (chronic obstructive pulmonary disease)    (CMD)   . Dementia (CMD)   . Diabetes mellitus    (CMD)    Dx > 30 yrs ago; most recent HgbA1c 7.1% on 02/2016  . Dizziness   . Epiretinal membrane (ERM) of left eye   . ESRD (end stage renal disease) on dialysis    (CMD)    [facility] MWF Dr Howell  . Fall down steps 03/09/2019  . GERD (gastroesophageal reflux disease)   . Gout   . Hearing loss    bilateral hearing aides not in use  . Hyperlipidemia   . Hypertension   . Long-term insulin use    (CMD)   . Memory loss   . Metabolic bone disease   . Migraine   . Mild nonproliferative diabetic retinopathy of both eyes    (CMD)   . Neuropathy     neuropathy with occasional leg tremors ? RLS  . OSA (obstructive sleep apnea)    CPAP  . PONV (postoperative nausea and vomiting)    only with hysterectomy 50 years ago  . Post-concussion headache 03/09/2019  . Seizures    (CMD)    started as a child, resolved then resumed in her 30s (on Topamax) sees Dr Scot, last seizure ~ 2015  . SOB (shortness of breath)    with DOE- negative stress test 10/03/15 ([facility])   . Stenosis of right renal artery   . Thyroid nodule incidentally noted  on imaging study 04/02/2020  . Transfusion history    ? 2015  . Vitamin D deficiency   [2] Past Surgical History: Procedure Laterality Date  . AV FISTULA PLACEMENT Right 11/12/2015   Procedure: RIGHT RADIA CEPHALIC A-V FISTULA;  Surgeon: Zachary Roys Loria Raddle., MD;  Location: [facility] MAIN OR;  Service: Vascular;  Laterality: Right;  . AV FISTULA REPAIR Right 04/07/2016   Procedure: A-V FISTULA REVISION WITH SUPERFICIALIZATION;  Surgeon:  Zachary Roys Loria Raddle., MD;  Location: [facility] MAIN OR;  Service: Vascular;  Laterality: Right;  . AV FISTULA REPAIR Right 04/07/2016   Procedure: RADIAL ARTERY LIGATION;  Surgeon: Zachary Roys Loria Raddle., MD;  Location: [facility] MAIN OR;  Service: Vascular;  Laterality: Right;  . BLADDER REPAIR     bladder tack  . CATARACT EXTRACTION Left 06/11/2020   Procedure: CATARACT EXTRACTION; MWE 20.0D [id number]  . CATARACT EXTRACTION W/  INTRAOCULAR LENS IMPLANT Left 06/11/2020   Procedure: CATARACT EXTRACTION LEFT EYE WITH IMPLANT;  Surgeon: Ozell Lemond Sar, MD;  Location: [facility] OUTPATIENT OR;  Service: Ophthalmology;  Laterality: Left;  30 TIP, PF LIDOCAINE, BSS/EPI (8:2), IDDM, MALYUGIN RING +/-  . CATARACT EXTRACTION W/  INTRAOCULAR LENS IMPLANT Right 08/01/2020   Procedure: CATARACT EXTRACTION RIGHT EYE /W IMPLANT;  Surgeon: Ozell Lemond Sar, MD;  Location: [facility] OUTPATIENT OR;  Service: Ophthalmology;  Laterality: Right;  30TIP; PF LIDOCAINE, BSS/EPI (8:2), IDDM, MALYUGIN RING +/-  . CATARACT EXTRACTION W/  INTRAOCULAR LENS IMPLANT Right 08/01/2020   Procedure: CATARACT EXTRACTION W/  INTRAOCULAR LENS IMPLANT; MWE  20.5  [id number]   [id number]   . CHOLECYSTECTOMY     Procedure: CHOLECYSTECTOMY  . COLONOSCOPY N/A 12/09/2017   Procedure: COLONOSCOPY;  Surgeon: Gunnar Chris Daniels, MD;  Location: [facility] ENDO OR;  Service: Gastroenterology;  Laterality: N/A;  . ESOPHAGOGASTRODUODENOSCOPY     Procedure: ESOPHAGOGASTRODUODENOSCOPY  . ESOPHAGOGASTRODUODENOSCOPY N/A 12/09/2017   Procedure: EGD;  Surgeon: Gunnar Chris Daniels, MD;  Location: [facility] ENDO OR;  Service: Gastroenterology;  Laterality: N/A;  . FOOT SURGERY Bilateral 09/14/2023   EXCISION FOOT WOUND performed by Fairy Prentice Miyamoto, MD at [facility] OR  . HYSTERECTOMY      Procedure: HYSTERECTOMY; total- 1974 (due to HMB 2/2 Fibroids)  . INCISION AND DRAINAGE OF WOUND Left 04/16/2023   INCISION AND DRAINAGE FOOT/ANKLE performed by Ozell DELENA Molt, DPM at [facility] OR   . INCISION AND DRAINAGE OF WOUND Left 04/24/2023   INCISION AND DRAINAGE FOOT/ANKLE performed by Elspeth Fairy Proud, DPM at [facility] OR  . TUBAL LIGATION     Procedure: TUBAL LIGATION  [3] Family History Problem Relation Name Age of Onset  . Diabetes Paternal Grandmother    . Hypertension Mother    . Cancer Mother    . Hypertension Father    . Kidney disease Father    . Coronary artery disease Father    . Hypertension Son    . Diabetes Sister    . Coronary artery disease Sister    . Coronary artery disease Brother    . Insulin resistance Brother    . Anesthesia problems Neg Hx    . Glaucoma Neg Hx    . Macular degeneration Neg Hx    . Blindness Neg Hx    [4] Social History Tobacco Use  . Smoking status: Some Days    Current packs/day: 0.15    Types: Cigarettes    Passive exposure: Current  . Smokeless tobacco: Never  . Tobacco comments:  Smokes 1 to one a half  Vaping Use  . Vaping status: Never Used  Substance Use Topics  . Alcohol use: Not Currently  . Drug use: Not Currently

## 2024-07-13 ENCOUNTER — Encounter (HOSPITAL_BASED_OUTPATIENT_CLINIC_OR_DEPARTMENT_OTHER): Payer: Self-pay | Admitting: Emergency Medicine

## 2024-07-13 ENCOUNTER — Other Ambulatory Visit: Payer: Self-pay

## 2024-07-13 ENCOUNTER — Emergency Department (HOSPITAL_BASED_OUTPATIENT_CLINIC_OR_DEPARTMENT_OTHER)
Admission: EM | Admit: 2024-07-13 | Discharge: 2024-07-13 | Disposition: A | Discharge status: Home / Self Care | Attending: Emergency Medicine | Admitting: Emergency Medicine

## 2024-07-13 ENCOUNTER — Emergency Department (HOSPITAL_BASED_OUTPATIENT_CLINIC_OR_DEPARTMENT_OTHER)

## 2024-07-13 DIAGNOSIS — E1122 Type 2 diabetes mellitus with diabetic chronic kidney disease: Secondary | ICD-10-CM | POA: Insufficient documentation

## 2024-07-13 DIAGNOSIS — Z992 Dependence on renal dialysis: Secondary | ICD-10-CM | POA: Insufficient documentation

## 2024-07-13 DIAGNOSIS — N186 End stage renal disease: Secondary | ICD-10-CM | POA: Insufficient documentation

## 2024-07-13 DIAGNOSIS — S0083XA Contusion of other part of head, initial encounter: Secondary | ICD-10-CM | POA: Diagnosis not present

## 2024-07-13 DIAGNOSIS — W1809XA Striking against other object with subsequent fall, initial encounter: Secondary | ICD-10-CM | POA: Insufficient documentation

## 2024-07-13 DIAGNOSIS — I120 Hypertensive chronic kidney disease with stage 5 chronic kidney disease or end stage renal disease: Secondary | ICD-10-CM | POA: Insufficient documentation

## 2024-07-13 DIAGNOSIS — S0990XA Unspecified injury of head, initial encounter: Secondary | ICD-10-CM | POA: Diagnosis present

## 2024-07-13 NOTE — Discharge Instructions (Signed)
 No sign of bleeding in the brain or any broken bones of the face.  Just very bad bruises and hematomas.  You can place cold compresses or ice pack no more than 20 minutes at a time and with time the swelling will get better.  It will most likely take weeks.

## 2024-07-13 NOTE — ED Triage Notes (Signed)
 Pt POV in wheelchair- pt with swollen L eye after fall on Tuesday.  Pt fell and hit her head, per daughter. Daughter reports eye initially swelled on Tuesday, went back down, now swollen again.  Today c/o headache, as well.   Denies blood thinners.  Pt does dialysis on M/W/Fri, compliant.

## 2024-07-13 NOTE — ED Provider Notes (Signed)
 Assumed care from Dr. Darra patient waiting on scan results.  I have independently visualized and interpreted pt's images today.  Head CT without evidence of intracranial bleed at this time.  Soft tissue injury noted.  Radiology reports no acute facial fractures but large periorbital hematoma and forehead scalp hematoma.  Discussed the findings with patient's family.  Recommended ongoing cold compresses and time.  At this time she appears stable for discharge.    Doretha Folks, MD 07/13/24 1616

## 2024-07-13 NOTE — ED Provider Notes (Signed)
 Emergency Department Provider Note   I have reviewed the triage vital signs and the nursing notes.   HISTORY  Chief Complaint Fall and Facial Swelling   HPI Patricia Bradley is a 73 y.o. female past history reviewed below including ESRD on Monday/Wednesday/Friday dialysis presents to the emergency department with continued scalp and eye swelling.  No severe worsening pain.  She had a fall in the past week and presented to the ED for evaluation.  She was sent home and after applying ice swelling seem to decrease but then increased in the past several days.  She denies any vision changes.  She continues to go to dialysis.  She takes a baby aspirin daily but is not otherwise anticoagulated. No AMS per family at bedside.   Past Medical History:  Diagnosis Date   Diabetes mellitus without complication (HCC)    Hypertension    Renal disorder     Review of Systems  Constitutional: No fever/chills Cardiovascular: Denies chest pain. Respiratory: Denies shortness of breath. Gastrointestinal: No abdominal pain.  No nausea, no vomiting.  Skin: Negative for rash. Neurological: Positive HA.   ____________________________________________   PHYSICAL EXAM:  VITAL SIGNS: ED Triage Vitals  Encounter Vitals Group     BP 07/13/24 1320 (!) 181/81     Pulse Rate 07/13/24 1320 78     Resp 07/13/24 1320 20     Temp 07/13/24 1320 98.6 F (37 C)     Temp src --      SpO2 07/13/24 1320 96 %     Weight 07/13/24 1321 227 lb 1.2 oz (103 kg)     Height 07/13/24 1321 5' 4 (1.626 m)   Constitutional: Alert and oriented. Well appearing and in no acute distress. Eyes: Conjunctivae are normal. PERRL. EOMI. periorbital swelling on the left.  Head: Left frontal scalp hematoma without laceration.  Nose: No congestion/rhinnorhea. Mouth/Throat: Mucous membranes are moist.   Neck: No stridor. No cervical spine tenderness to palpation. Cardiovascular: Normal rate, regular rhythm. Good peripheral  circulation. Grossly normal heart sounds.   Respiratory: Normal respiratory effort.  No retractions. Lungs CTAB. Gastrointestinal: No distention.  Musculoskeletal: No gross deformities of extremities. Neurologic:  Normal speech and language.   ____________________________________________  RADIOLOGY  No results found.  ____________________________________________   PROCEDURES  Procedure(s) performed:   Procedures  None  ____________________________________________   INITIAL IMPRESSION / ASSESSMENT AND PLAN / ED COURSE  Pertinent labs & imaging results that were available during my care of the patient were reviewed by me and considered in my medical decision making (see chart for details).   This patient is Presenting for Evaluation of head injury, which does require a range of treatment options, and is a complaint that involves a high risk of morbidity and mortality.  The Differential Diagnoses includes subdural hematoma, epidural hematoma, acute concussion, traumatic subarachnoid hemorrhage, cerebral contusions, etc.  I did obtain Additional Historical Information from family at bedside.   Radiologic Tests Ordered, included CT head and max/face. I independently interpreted the images and agree with radiology interpretation.   Medical Decision Making: Summary:  The patient presents to the emergency department with worsening swelling after a fall.  I am able to visualize both eyes and patient's extraocular movements are intact without evidence of entrapment.  No vision changes.  Plan for repeat imaging and reassess.  She is not anticoagulated.  Reevaluation with update and discussion with   ***Considered admission***  Patient's presentation is most consistent with acute presentation with potential  threat to life or bodily function.   Disposition:   ____________________________________________  FINAL CLINICAL IMPRESSION(S) / ED DIAGNOSES  Final diagnoses:  None      NEW OUTPATIENT MEDICATIONS STARTED DURING THIS VISIT:  New Prescriptions   No medications on file    Note:  This document was prepared using Dragon voice recognition software and may include unintentional dictation errors.  Fonda Law, MD, [facility] Emergency Medicine

## 2024-07-22 ENCOUNTER — Emergency Department (HOSPITAL_BASED_OUTPATIENT_CLINIC_OR_DEPARTMENT_OTHER)
Admission: EM | Admit: 2024-07-22 | Discharge: 2024-07-22 | Disposition: A | Discharge status: Home / Self Care | Attending: Emergency Medicine | Admitting: Emergency Medicine

## 2024-07-22 ENCOUNTER — Encounter (HOSPITAL_BASED_OUTPATIENT_CLINIC_OR_DEPARTMENT_OTHER): Payer: Self-pay | Admitting: Emergency Medicine

## 2024-07-22 ENCOUNTER — Emergency Department (HOSPITAL_BASED_OUTPATIENT_CLINIC_OR_DEPARTMENT_OTHER)

## 2024-07-22 DIAGNOSIS — I120 Hypertensive chronic kidney disease with stage 5 chronic kidney disease or end stage renal disease: Secondary | ICD-10-CM | POA: Diagnosis not present

## 2024-07-22 DIAGNOSIS — Z7984 Long term (current) use of oral hypoglycemic drugs: Secondary | ICD-10-CM | POA: Insufficient documentation

## 2024-07-22 DIAGNOSIS — S8992XA Unspecified injury of left lower leg, initial encounter: Secondary | ICD-10-CM | POA: Diagnosis present

## 2024-07-22 DIAGNOSIS — M25561 Pain in right knee: Secondary | ICD-10-CM | POA: Insufficient documentation

## 2024-07-22 DIAGNOSIS — W19XXXA Unspecified fall, initial encounter: Secondary | ICD-10-CM | POA: Diagnosis not present

## 2024-07-22 DIAGNOSIS — N186 End stage renal disease: Secondary | ICD-10-CM | POA: Diagnosis not present

## 2024-07-22 DIAGNOSIS — S80212A Abrasion, left knee, initial encounter: Secondary | ICD-10-CM | POA: Diagnosis not present

## 2024-07-22 DIAGNOSIS — Z992 Dependence on renal dialysis: Secondary | ICD-10-CM | POA: Diagnosis not present

## 2024-07-22 DIAGNOSIS — Z79899 Other long term (current) drug therapy: Secondary | ICD-10-CM | POA: Insufficient documentation

## 2024-07-22 DIAGNOSIS — Z7982 Long term (current) use of aspirin: Secondary | ICD-10-CM | POA: Insufficient documentation

## 2024-07-22 DIAGNOSIS — Z794 Long term (current) use of insulin: Secondary | ICD-10-CM | POA: Insufficient documentation

## 2024-07-22 DIAGNOSIS — E1122 Type 2 diabetes mellitus with diabetic chronic kidney disease: Secondary | ICD-10-CM | POA: Insufficient documentation

## 2024-07-22 NOTE — ED Triage Notes (Signed)
 Pt c/o RT knee pain and a bruise to LT knee; she fell around 7/17 and was seen for head injury; unable to bear weight for long on RT knee

## 2024-07-22 NOTE — ED Provider Notes (Signed)
 [facility] Provider Note   CSN: [id number] Arrival date & time: 07/22/24  1549     Patient presents with: Knee Pain   Patricia Bradley is a 73 y.o. female with a past medical history of ERSD with HD MWF, PAD, HTN, and insulin dependent T2DM presents to the ED for right knee pain. This is subsequent to her fall on 7/15. She states the pain is dull and bruise like, and has baseline numbness and tingling from her neuropathy that has not worsened. Her daughter is bedside. After clarification, the patient reports no new symptoms since her fall other than the knee pain, and both her and the daughter wanted to get Xrays of the knees done to r/o any dislocations or soft tissue damage.         Prior to Admission medications   Medication Sig Start Date End Date Taking? Authorizing Provider  albuterol  (PROVENTIL  HFA;VENTOLIN  HFA) 108 (90 Base) MCG/ACT inhaler Inhale 2 puffs into the lungs every 6 (six) hours as needed for wheezing or shortness of breath.    [provider]  albuterol  (PROVENTIL  HFA;VENTOLIN  HFA) 108 (90 Base) MCG/ACT inhaler Inhale 2 puffs into the lungs every 4 (four) hours as needed for wheezing or shortness of breath. 06/15/18   Harris, Abigail, PA-C  ALPRAZolam (XANAX) 0.25 MG tablet Take 0.25 mg by mouth daily as needed for anxiety.    [provider]  amLODipine (NORVASC) 5 MG tablet Take 5 mg by mouth daily.    [provider]  aspirin EC 81 MG tablet Take 81 mg by mouth daily.    [provider]  benzonatate  (TESSALON ) 100 MG capsule Take 2 capsules (200 mg total) by mouth 2 (two) times daily as needed for cough. 06/15/18   Harris, Abigail, PA-C  calcium acetate (PHOSLO) 667 MG capsule Take 667-1,334 mg by mouth See admin instructions. Take 1334mg  three times a day after meals, Take 667mg  after snacks    [provider]  cetirizine (ZYRTEC) 10 MG tablet Take 5 mg by mouth daily.     [provider]  Cholecalciferol 2000 units CAPS Take 2,000 Units by mouth daily.    [provider]  clobetasol ointment (TEMOVATE) 0.05 % Apply 1 application topically daily. To feet    [provider]  clotrimazole-betamethasone (LOTRISONE) lotion Apply 1 application topically 2 (two) times daily.    [provider]  esomeprazole (NEXIUM) 40 MG capsule Take 40 mg by mouth daily at 12 noon.    [provider]  famotidine  (PEPCID ) 20 MG tablet Take 1 tablet (20 mg total) by mouth 2 (two) times daily. 06/15/18   Harris, Abigail, PA-C  febuxostat (ULORIC) 40 MG tablet Take 40 mg by mouth daily.    [provider]  fluticasone (FLONASE) 50 MCG/ACT nasal spray Place 1 spray into both nostrils daily.    [provider]  Fluticasone-Salmeterol (ADVAIR) 100-50 MCG/DOSE AEPB Inhale 1 puff into the lungs 2 (two) times daily.    [provider]  gabapentin (NEURONTIN) 300 MG capsule Take 300 mg by mouth at bedtime.    [provider]  hydrocortisone 2.5 % cream Apply 1 application topically 2 (two) times daily.    [provider]  Insulin Glargine (BASAGLAR KWIKPEN) 100 UNIT/ML SOPN Inject 10 Units into the skin at bedtime.    [provider]  insulin lispro (HUMALOG KWIKPEN) 100 UNIT/ML KiwkPen Inject 3 Units into the skin 3 (three) times  daily. Before meals    [provider]  ipratropium (ATROVENT) 0.06 % nasal spray Place 2 sprays into both nostrils 3 (three) times daily.    [provider]  isosorbide mononitrate (IMDUR) 60 MG 24 hr tablet Take 60 mg by mouth daily.    [provider]  lidocaine (XYLOCAINE) 5 % ointment Apply 1 application topically daily as needed for mild pain.    [provider]  metoprolol succinate (TOPROL-XL) 50 MG 24 hr tablet Take 50 mg by mouth daily. Take with or immediately following a meal.    [provider]  Multiple  Vitamins-Minerals (CENTRUM SILVER 50+WOMEN) TABS Take 1 tablet by mouth daily.    [provider]  Naftifine HCl (NAFTIN) 2 % CREA Apply 1 g topically 2 (two) times daily. Starting at feet    [provider]  nitroGLYCERIN (NITROSTAT) 0.4 MG SL tablet Place 0.4 mg under the tongue every 5 (five) minutes as needed for chest pain.    [provider]  NON FORMULARY CPAP Nightly    [provider]  omega-3 fish oil (MAXEPA) 1000 MG CAPS capsule Take 1 capsule by mouth daily.    [provider]  ondansetron (ZOFRAN-ODT) 4 MG disintegrating tablet Take 4 mg by mouth every 8 (eight) hours as needed for nausea or vomiting.    [provider]  rosuvastatin (CRESTOR) 10 MG tablet Take 10 mg by mouth daily.    [provider]  Spacer/Aero-Holding Chambers (AEROCHAMBER PLUS WITH MASK) inhaler Use as instructed 06/15/18   Harris, Abigail, PA-C  topiramate (TOPAMAX) 100 MG tablet Take 100 mg by mouth at bedtime.    [provider]  torsemide (DEMADEX) 20 MG tablet Take 40 mg by mouth daily.    [provider]    Allergies: Tramadol, Memantine, Metformin, Penicillins, Contrast media [iodinated contrast media], Aspirin, Fentanyl, and Hydrocodone-acetaminophen     Review of Systems  Cardiovascular:  Negative for leg swelling.  Musculoskeletal:  Positive for arthralgias.  Neurological:  Positive for weakness and numbness.  All other systems reviewed and are negative.   Updated Vital Signs BP (!) 127/95 (BP Location: Left Arm)   Pulse 86   Temp 98.1 F (36.7 C) (Oral)   Resp 20   Ht 5' 4 (1.626 m)   Wt 103 kg   SpO2 96%   BMI 38.96 kg/m   Physical Exam Constitutional:      Appearance: Normal appearance.  HENT:     Head: Normocephalic.  Eyes:     Extraocular Movements: Extraocular movements intact.     Pupils: Pupils are equal, round, and reactive to light.  Cardiovascular:     Rate and Rhythm: Normal rate and  regular rhythm.     Heart sounds: Normal heart sounds.     Comments: Bilateral DP/PT pulses reduced, +1. Radial pulses normal.  Pulmonary:     Effort: Pulmonary effort is normal.     Breath sounds: Normal breath sounds.  Abdominal:     General: Bowel sounds are normal.     Palpations: Abdomen is soft.     Tenderness: There is no abdominal tenderness.  Musculoskeletal:        General: No swelling, tenderness or deformity. Normal range of motion.     Right lower leg: No edema.     Left lower leg: No edema.     Comments: Left knee abrasion from fall on 7/15. No obvious swelling, erythema, or bruising of the right knee. Patient reports  no tenderness to palpation    Skin:    General: Skin is warm and dry.  Neurological:     General: No focal deficit present.     Mental Status: She is alert and oriented to person, place, and time.  Psychiatric:        Mood and Affect: Mood normal.        Behavior: Behavior normal.     (all labs ordered are listed, but only abnormal results are displayed) Labs Reviewed - No data to display  EKG: None  Radiology: DG Knee Complete 4 Views Right Result Date: 07/22/2024 CLINICAL DATA:  Fall EXAM: RIGHT KNEE - COMPLETE 4+ VIEW COMPARISON:  None Available. FINDINGS: No evidence of fracture, dislocation, or joint effusion. No evidence of arthropathy or other focal bone abnormality. Peripheral vascular calcifications are present. Soft tissues are otherwise within normal limits. IMPRESSION: Negative. Electronically Signed   By: Greig Pique M.D.   On: 07/22/2024 17:01   DG Knee Complete 4 Views Left Result Date: 07/22/2024 CLINICAL DATA:  Fall with knee pain EXAM: LEFT KNEE - COMPLETE 4+ VIEW COMPARISON:  None Available. FINDINGS: No evidence of fracture, dislocation, or joint effusion. No evidence of arthropathy or other focal bone abnormality. Vascular stent is seen posterior to the distal femur. Peripheral vascular calcifications are present. IMPRESSION:  Negative. Electronically Signed   By: Greig Pique M.D.   On: 07/22/2024 16:58     Procedures   Medications Ordered in the ED - No data to display                                  Medical Decision Making Patient presented to ED for right knee pain s/p mechanical fall on 7/15. Previous ED visit, did not have this complaint, but noticed pain gradually increasing in the knees. Uses walker at home, but is not always complaint with it. Differential diagnoses includes septic arthritis vs occult fracture vs osteonecrosis vs meniscal tear vs bursitis vs osteoarthritis. Xrays of bilateral knees ordered in triage. Bilateral Xrays show no evidence of fracture, dislocation, or joint effusion. No evidence of arthropathy or other focal bone abnormality.  Amount and/or Complexity of Data Reviewed Radiology: ordered.       Final diagnoses:  None    ED Discharge Orders     None          Allura Doepke, DO 07/22/24 AMOS Dreama Longs, MD 07/23/24 [phone]

## 2024-07-22 NOTE — Discharge Instructions (Addendum)
 You came to the ED for knee pain that has been present since your fall on 7/15. Your XR shows no evidence of fracture. Given slow worsening of pain, ability to bear weight, negative XR today, have low suspicion for a fracture that we cannot see on XR.  Follow up closely with your physician regarding the knee pain. You may take MAXIMUM 4000mg  of tylenol  in one day, 1000mg  at one time.  We discussed taking 650mg  at 7PM, 3AM and after dialysis as regimen.

## 2024-10-27 ENCOUNTER — Other Ambulatory Visit: Payer: Self-pay

## 2024-10-27 ENCOUNTER — Observation Stay (HOSPITAL_COMMUNITY)
Admission: EM | Admit: 2024-10-27 | Discharge: 2024-10-29 | Disposition: A | Discharge status: Home / Self Care | Attending: Internal Medicine | Admitting: Internal Medicine

## 2024-10-27 DIAGNOSIS — Z79899 Other long term (current) drug therapy: Secondary | ICD-10-CM | POA: Diagnosis not present

## 2024-10-27 DIAGNOSIS — E875 Hyperkalemia: Secondary | ICD-10-CM | POA: Diagnosis not present

## 2024-10-27 DIAGNOSIS — R531 Weakness: Secondary | ICD-10-CM | POA: Diagnosis present

## 2024-10-27 DIAGNOSIS — Z794 Long term (current) use of insulin: Secondary | ICD-10-CM | POA: Insufficient documentation

## 2024-10-27 DIAGNOSIS — E1142 Type 2 diabetes mellitus with diabetic polyneuropathy: Secondary | ICD-10-CM | POA: Diagnosis not present

## 2024-10-27 DIAGNOSIS — M109 Gout, unspecified: Secondary | ICD-10-CM | POA: Diagnosis not present

## 2024-10-27 DIAGNOSIS — Z992 Dependence on renal dialysis: Secondary | ICD-10-CM | POA: Diagnosis not present

## 2024-10-27 DIAGNOSIS — M48061 Spinal stenosis, lumbar region without neurogenic claudication: Secondary | ICD-10-CM | POA: Insufficient documentation

## 2024-10-27 DIAGNOSIS — E1122 Type 2 diabetes mellitus with diabetic chronic kidney disease: Secondary | ICD-10-CM | POA: Diagnosis not present

## 2024-10-27 DIAGNOSIS — N186 End stage renal disease: Secondary | ICD-10-CM | POA: Diagnosis not present

## 2024-10-27 DIAGNOSIS — I70221 Atherosclerosis of native arteries of extremities with rest pain, right leg: Secondary | ICD-10-CM | POA: Insufficient documentation

## 2024-10-27 DIAGNOSIS — Z6837 Body mass index (BMI) 37.0-37.9, adult: Secondary | ICD-10-CM | POA: Insufficient documentation

## 2024-10-27 DIAGNOSIS — Z7982 Long term (current) use of aspirin: Secondary | ICD-10-CM | POA: Diagnosis not present

## 2024-10-27 DIAGNOSIS — Z7901 Long term (current) use of anticoagulants: Secondary | ICD-10-CM | POA: Insufficient documentation

## 2024-10-27 DIAGNOSIS — I120 Hypertensive chronic kidney disease with stage 5 chronic kidney disease or end stage renal disease: Secondary | ICD-10-CM | POA: Diagnosis not present

## 2024-10-27 DIAGNOSIS — J449 Chronic obstructive pulmonary disease, unspecified: Secondary | ICD-10-CM | POA: Diagnosis not present

## 2024-10-27 DIAGNOSIS — I160 Hypertensive urgency: Secondary | ICD-10-CM | POA: Diagnosis not present

## 2024-10-27 DIAGNOSIS — Z8679 Personal history of other diseases of the circulatory system: Secondary | ICD-10-CM | POA: Insufficient documentation

## 2024-10-27 DIAGNOSIS — E66811 Obesity, class 1: Secondary | ICD-10-CM | POA: Insufficient documentation

## 2024-10-27 DIAGNOSIS — D631 Anemia in chronic kidney disease: Secondary | ICD-10-CM | POA: Diagnosis not present

## 2024-10-27 DIAGNOSIS — R627 Adult failure to thrive: Secondary | ICD-10-CM | POA: Diagnosis not present

## 2024-10-27 HISTORY — DX: End stage renal disease: N18.6

## 2024-10-27 NOTE — ED Triage Notes (Signed)
 73 yo female BIB [facility] from patient's daughter's home, A/O x 4. Pt reported weakness while ambulating from the car to the home. She missed dialysis today and typically goes MWF to get 2 lbs fluid taken off at a time. Hx of hypomagnesia and hypokalemia   EMS vitals BP 246 palpated , second reading down to 208/78  Refused CBG in transport  Unremarkable EKG in transit, sinus rhythm

## 2024-10-28 ENCOUNTER — Emergency Department (HOSPITAL_COMMUNITY)

## 2024-10-28 ENCOUNTER — Encounter (HOSPITAL_COMMUNITY): Payer: Self-pay

## 2024-10-28 DIAGNOSIS — I160 Hypertensive urgency: Secondary | ICD-10-CM | POA: Diagnosis not present

## 2024-10-28 DIAGNOSIS — E875 Hyperkalemia: Secondary | ICD-10-CM | POA: Diagnosis present

## 2024-10-28 DIAGNOSIS — N186 End stage renal disease: Secondary | ICD-10-CM

## 2024-10-28 LAB — HEMOGLOBIN A1C
Hgb A1c MFr Bld: 4.9 % (ref 4.8–5.6)
Mean Plasma Glucose: 93.93 mg/dL

## 2024-10-28 LAB — TROPONIN I (HIGH SENSITIVITY)
Troponin I (High Sensitivity): 19 ng/L — ABNORMAL HIGH
Troponin I (High Sensitivity): 15 ng/L (ref <18)

## 2024-10-28 LAB — CBC WITH DIFFERENTIAL/PLATELET
Abs Immature Granulocytes: 0.01 K/uL (ref 0.00–0.07)
Basophils Absolute: 0.1 K/uL (ref 0.0–0.1)
Basophils Relative: 1 %
Eosinophils Absolute: 0.2 K/uL (ref 0.0–0.5)
Eosinophils Relative: 3 %
HCT: 40.3 % (ref 36.0–46.0)
Hemoglobin: 12.3 g/dL (ref 12.0–15.0)
Immature Granulocytes: 0 %
Lymphocytes Relative: 17 %
Lymphs Abs: 1.4 K/uL (ref 0.7–4.0)
MCH: 27 pg (ref 26.0–34.0)
MCHC: 30.5 g/dL (ref 30.0–36.0)
MCV: 88.4 fL (ref 80.0–100.0)
Monocytes Absolute: 0.4 K/uL (ref 0.1–1.0)
Monocytes Relative: 5 %
Neutro Abs: 5.9 K/uL (ref 1.7–7.7)
Neutrophils Relative %: 74 %
Platelets: 227 K/uL (ref 150–400)
RBC: 4.56 MIL/uL (ref 3.87–5.11)
RDW: 20.8 % — ABNORMAL HIGH (ref 11.5–15.5)
WBC: 8 K/uL (ref 4.0–10.5)
nRBC: 0 % (ref 0.0–0.2)

## 2024-10-28 LAB — BRAIN NATRIURETIC PEPTIDE: B Natriuretic Peptide: 419.6 pg/mL — ABNORMAL HIGH (ref 0.0–100.0)

## 2024-10-28 LAB — HEPATITIS B SURFACE ANTIGEN: Hepatitis B Surface Ag: NONREACTIVE

## 2024-10-28 LAB — COMPREHENSIVE METABOLIC PANEL WITH GFR
ALT: 12 U/L (ref 0–44)
AST: 22 U/L (ref 15–41)
Albumin: 3.6 g/dL (ref 3.5–5.0)
Alkaline Phosphatase: 88 U/L (ref 38–126)
Anion gap: 16 — ABNORMAL HIGH (ref 5–15)
BUN: 41 mg/dL — ABNORMAL HIGH (ref 8–23)
CO2: 25 mmol/L (ref 22–32)
Calcium: 9.1 mg/dL (ref 8.9–10.3)
Chloride: 95 mmol/L — ABNORMAL LOW (ref 98–111)
Creatinine, Ser: 7.77 mg/dL — ABNORMAL HIGH (ref 0.44–1.00)
GFR, Estimated: 5 mL/min — ABNORMAL LOW
Glucose, Bld: 194 mg/dL — ABNORMAL HIGH (ref 70–99)
Potassium: 5.7 mmol/L — ABNORMAL HIGH (ref 3.5–5.1)
Sodium: 136 mmol/L (ref 135–145)
Total Bilirubin: 0.8 mg/dL (ref 0.0–1.2)
Total Protein: 7.1 g/dL (ref 6.5–8.1)

## 2024-10-28 LAB — GLUCOSE, CAPILLARY
Glucose-Capillary: 105 mg/dL — ABNORMAL HIGH (ref 70–99)
Glucose-Capillary: 177 mg/dL — ABNORMAL HIGH (ref 70–99)

## 2024-10-28 MED ORDER — ATORVASTATIN CALCIUM 10 MG PO TABS
20.0000 mg | ORAL_TABLET | Freq: Every day | ORAL | Status: DC
  Administered 2024-10-28: 20 mg via ORAL
  Filled 2024-10-28: qty 2

## 2024-10-28 MED ORDER — ALLOPURINOL 100 MG PO TABS
100.0000 mg | ORAL_TABLET | Freq: Every day | ORAL | Status: DC
  Administered 2024-10-28 – 2024-10-29 (×2): 100 mg via ORAL
  Filled 2024-10-28 (×2): qty 1

## 2024-10-28 MED ORDER — FLUTICASONE FUROATE-VILANTEROL 100-25 MCG/ACT IN AEPB
1.0000 | INHALATION_SPRAY | Freq: Every day | RESPIRATORY_TRACT | Status: DC
Start: 2024-10-28 — End: 2024-10-30
  Administered 2024-10-29: 1 via RESPIRATORY_TRACT
  Filled 2024-10-28: qty 28

## 2024-10-28 MED ORDER — HEPARIN SODIUM (PORCINE) 1000 UNIT/ML DIALYSIS: 2000.0000 [IU] | Freq: Once | INTRAMUSCULAR | Status: DC

## 2024-10-28 MED ORDER — CARVEDILOL 25 MG PO TABS
25.0000 mg | ORAL_TABLET | Freq: Two times a day (BID) | ORAL | Status: DC
  Administered 2024-10-29 (×2): 25 mg via ORAL
  Filled 2024-10-28 (×3): qty 1

## 2024-10-28 MED ORDER — DICLOFENAC SODIUM 1 % EX GEL
2.0000 g | Freq: Four times a day (QID) | CUTANEOUS | Status: DC
  Administered 2024-10-28 – 2024-10-29 (×5): 2 g via TOPICAL
  Filled 2024-10-28 (×2): qty 100

## 2024-10-28 MED ORDER — INSULIN ASPART 100 UNIT/ML IJ SOLN: 0.0000 [IU] | Freq: Every day | INTRAMUSCULAR | Status: DC

## 2024-10-28 MED ORDER — HEPARIN SODIUM (PORCINE) 5000 UNIT/ML IJ SOLN
5000.0000 [IU] | Freq: Three times a day (TID) | INTRAMUSCULAR | Status: DC
  Administered 2024-10-28 – 2024-10-29 (×3): 5000 [IU] via SUBCUTANEOUS
  Filled 2024-10-28 (×3): qty 1

## 2024-10-28 MED ORDER — HEPARIN SODIUM (PORCINE) 1000 UNIT/ML DIALYSIS: 1000.0000 [IU] | INTRAMUSCULAR | Status: DC | PRN

## 2024-10-28 MED ORDER — AMLODIPINE BESYLATE 2.5 MG PO TABS
2.5000 mg | ORAL_TABLET | Freq: Every day | ORAL | Status: DC
  Administered 2024-10-29: 2.5 mg via ORAL
  Filled 2024-10-28 (×2): qty 1

## 2024-10-28 MED ORDER — PENTAFLUOROPROP-TETRAFLUOROETH EX AERO
1.0000 | INHALATION_SPRAY | CUTANEOUS | Status: DC | PRN
Start: 2024-10-28 — End: 2024-10-28

## 2024-10-28 MED ORDER — ISOSORBIDE MONONITRATE ER 60 MG PO TB24
60.0000 mg | ORAL_TABLET | Freq: Every day | ORAL | Status: DC
  Administered 2024-10-28 – 2024-10-29 (×2): 60 mg via ORAL
  Filled 2024-10-28: qty 2
  Filled 2024-10-28: qty 1

## 2024-10-28 MED ORDER — ALPRAZOLAM 0.25 MG PO TABS: 0.2500 mg | ORAL_TABLET | Freq: Every day | ORAL | Status: DC | PRN

## 2024-10-28 MED ORDER — NEPRO/CARBSTEADY PO LIQD
237.0000 mL | ORAL | Status: DC | PRN
  Filled 2024-10-28: qty 237

## 2024-10-28 MED ORDER — ASPIRIN 81 MG PO TBEC
81.0000 mg | DELAYED_RELEASE_TABLET | Freq: Every day | ORAL | Status: DC
Start: 2024-10-28 — End: 2024-10-30
  Administered 2024-10-28 – 2024-10-29 (×2): 81 mg via ORAL
  Filled 2024-10-28 (×2): qty 1

## 2024-10-28 MED ORDER — CLOPIDOGREL BISULFATE 75 MG PO TABS
75.0000 mg | ORAL_TABLET | Freq: Every day | ORAL | Status: DC
  Administered 2024-10-28 – 2024-10-29 (×2): 75 mg via ORAL
  Filled 2024-10-28 (×2): qty 1

## 2024-10-28 MED ORDER — HYDROCORTISONE 1 % EX CREA
TOPICAL_CREAM | Freq: Two times a day (BID) | CUTANEOUS | Status: DC
  Filled 2024-10-28: qty 28

## 2024-10-28 MED ORDER — ANTICOAGULANT SODIUM CITRATE 4% (200MG/5ML) IV SOLN
5.0000 mL | Status: DC | PRN
  Filled 2024-10-28: qty 5

## 2024-10-28 MED ORDER — HYDRALAZINE HCL 20 MG/ML IJ SOLN: 10.0000 mg | INTRAMUSCULAR | Status: DC | PRN

## 2024-10-28 MED ORDER — ALTEPLASE 2 MG IJ SOLR: 2.0000 mg | Freq: Once | INTRAMUSCULAR | Status: DC | PRN

## 2024-10-28 MED ORDER — AMLODIPINE BESYLATE 5 MG PO TABS
5.0000 mg | ORAL_TABLET | Freq: Once | ORAL | Status: AC
Start: 2024-10-28 — End: 2024-10-28
  Administered 2024-10-28: 5 mg via ORAL
  Filled 2024-10-28: qty 1

## 2024-10-28 MED ORDER — LIDOCAINE-PRILOCAINE 2.5-2.5 % EX CREA: 1.0000 | TOPICAL_CREAM | CUTANEOUS | Status: DC | PRN

## 2024-10-28 MED ORDER — LIDOCAINE HCL (PF) 1 % IJ SOLN: 5.0000 mL | INTRAMUSCULAR | Status: DC | PRN

## 2024-10-28 MED ORDER — NITROGLYCERIN 0.4 MG SL SUBL: 0.4000 mg | SUBLINGUAL_TABLET | SUBLINGUAL | Status: DC | PRN

## 2024-10-28 MED ORDER — METOPROLOL SUCCINATE ER 25 MG PO TB24
50.0000 mg | ORAL_TABLET | Freq: Every day | ORAL | Status: DC
  Administered 2024-10-28: 50 mg via ORAL
  Filled 2024-10-28: qty 2

## 2024-10-28 MED ORDER — INSULIN ASPART 100 UNIT/ML IJ SOLN
0.0000 [IU] | Freq: Three times a day (TID) | INTRAMUSCULAR | Status: DC
  Administered 2024-10-29: 1 [IU] via SUBCUTANEOUS
  Administered 2024-10-29: 2 [IU] via SUBCUTANEOUS

## 2024-10-28 MED ORDER — ACETAMINOPHEN 650 MG RE SUPP: 650.0000 mg | Freq: Four times a day (QID) | RECTAL | Status: DC | PRN

## 2024-10-28 MED ORDER — TOPIRAMATE 25 MG PO TABS
100.0000 mg | ORAL_TABLET | Freq: Every day | ORAL | Status: DC
  Administered 2024-10-28: 100 mg via ORAL
  Filled 2024-10-28: qty 4

## 2024-10-28 MED ORDER — ACETAMINOPHEN 325 MG PO TABS
650.0000 mg | ORAL_TABLET | Freq: Four times a day (QID) | ORAL | Status: DC | PRN
  Administered 2024-10-28 (×2): 650 mg via ORAL
  Filled 2024-10-28 (×2): qty 2

## 2024-10-28 MED ORDER — SODIUM ZIRCONIUM CYCLOSILICATE 10 G PO PACK: 10.0000 g | PACK | Freq: Once | ORAL | Status: DC

## 2024-10-28 MED ORDER — SODIUM ZIRCONIUM CYCLOSILICATE 10 G PO PACK
10.0000 g | PACK | Freq: Once | ORAL | Status: AC
  Administered 2024-10-28: 10 g via ORAL
  Filled 2024-10-28: qty 1

## 2024-10-28 MED ORDER — PANTOPRAZOLE SODIUM 40 MG PO TBEC
40.0000 mg | DELAYED_RELEASE_TABLET | Freq: Every day | ORAL | Status: DC
Start: 2024-10-28 — End: 2024-10-30
  Administered 2024-10-28 – 2024-10-29 (×2): 40 mg via ORAL
  Filled 2024-10-28 (×2): qty 1

## 2024-10-28 NOTE — ED Notes (Signed)
Signed and held orders released.

## 2024-10-28 NOTE — ED Provider Notes (Signed)
 [place] [facility] AT [facility] Provider Note   CSN: [id number] Arrival date & time: 10/27/24  2353     Patient presents with: Weakness   Patricia Bradley is a 73 y.o. female.   Patient with a history of hypertension, diabetes, ESRD on dialysis, Monday Wednesday Friday.  Did not have treatment today.  She was discharged from rehab today.  She presents from her daughter's home tonight with generalized weakness and leg weakness.  States she has chronic back pain with new weakness in her legs over the past 2 to 3 days.  Denies any new fall or trauma.  Denies any chest pain, shortness of breath, nausea or vomiting.  No headache.  No fever.  Did not take her blood pressure medications tonight.  Did not have a dialysis today.  She was discharged from rehab today and daughter was concerned because she was weak in her legs and having back pain.  Does not make urine.  No bowel incontinence.  No new weakness in her legs.  No numbness or tingling.  No chest pain or shortness of breath.  No history of IV drug abuse or cancer.  The history is provided by the patient.  Weakness Associated symptoms: no abdominal pain, no chest pain, no cough, no dizziness, no dysuria, no fever, no headaches and no shortness of breath        Prior to Admission medications   Medication Sig Start Date End Date Taking? Authorizing Provider  albuterol  (PROVENTIL  HFA;VENTOLIN  HFA) 108 (90 Base) MCG/ACT inhaler Inhale 2 puffs into the lungs every 6 (six) hours as needed for wheezing or shortness of breath.    [provider]  albuterol  (PROVENTIL  HFA;VENTOLIN  HFA) 108 (90 Base) MCG/ACT inhaler Inhale 2 puffs into the lungs every 4 (four) hours as needed for wheezing or shortness of breath. 06/15/18   Harris, Abigail, PA-C  ALPRAZolam (XANAX) 0.25 MG tablet Take 0.25 mg by mouth daily as needed for anxiety.    [provider]  amLODipine (NORVASC) 5 MG tablet Take 5 mg by mouth daily.     [provider]  aspirin EC 81 MG tablet Take 81 mg by mouth daily.    [provider]  benzonatate  (TESSALON ) 100 MG capsule Take 2 capsules (200 mg total) by mouth 2 (two) times daily as needed for cough. 06/15/18   Harris, Abigail, PA-C  calcium acetate (PHOSLO) 667 MG capsule Take 667-1,334 mg by mouth See admin instructions. Take 1334mg  three times a day after meals, Take 667mg  after snacks    [provider]  cetirizine (ZYRTEC) 10 MG tablet Take 5 mg by mouth daily.    [provider]  Cholecalciferol 2000 units CAPS Take 2,000 Units by mouth daily.    [provider]  clobetasol ointment (TEMOVATE) 0.05 % Apply 1 application topically daily. To feet    [provider]  clotrimazole-betamethasone (LOTRISONE) lotion Apply 1 application topically 2 (two) times daily.    [provider]  esomeprazole (NEXIUM) 40 MG capsule Take 40 mg by mouth daily at 12 noon.    [provider]  famotidine  (PEPCID ) 20 MG tablet Take 1 tablet (20 mg total) by mouth 2 (two) times daily. 06/15/18   Harris, Abigail, PA-C  febuxostat (ULORIC) 40 MG tablet Take 40 mg by mouth daily.    [provider]  fluticasone (FLONASE) 50 MCG/ACT nasal spray Place 1 spray into both nostrils daily.    [provider]  Fluticasone-Salmeterol (  ADVAIR) 100-50 MCG/DOSE AEPB Inhale 1 puff into the lungs 2 (two) times daily.    [provider]  gabapentin (NEURONTIN) 300 MG capsule Take 300 mg by mouth at bedtime.    [provider]  hydrocortisone 2.5 % cream Apply 1 application topically 2 (two) times daily.    [provider]  Insulin Glargine (BASAGLAR KWIKPEN) 100 UNIT/ML SOPN Inject 10 Units into the skin at bedtime.    [provider]  insulin lispro (HUMALOG KWIKPEN) 100 UNIT/ML KiwkPen Inject 3 Units into the skin 3 (three) times daily. Before meals    [provider]  ipratropium (ATROVENT)  0.06 % nasal spray Place 2 sprays into both nostrils 3 (three) times daily.    [provider]  isosorbide mononitrate (IMDUR) 60 MG 24 hr tablet Take 60 mg by mouth daily.    [provider]  lidocaine (XYLOCAINE) 5 % ointment Apply 1 application topically daily as needed for mild pain.    [provider]  metoprolol succinate (TOPROL-XL) 50 MG 24 hr tablet Take 50 mg by mouth daily. Take with or immediately following a meal.    [provider]  Multiple Vitamins-Minerals (CENTRUM SILVER 50+WOMEN) TABS Take 1 tablet by mouth daily.    [provider]  Naftifine HCl (NAFTIN) 2 % CREA Apply 1 g topically 2 (two) times daily. Starting at feet    [provider]  nitroGLYCERIN (NITROSTAT) 0.4 MG SL tablet Place 0.4 mg under the tongue every 5 (five) minutes as needed for chest pain.    [provider]  NON FORMULARY CPAP Nightly    [provider]  omega-3 fish oil (MAXEPA) 1000 MG CAPS capsule Take 1 capsule by mouth daily.    [provider]  ondansetron (ZOFRAN-ODT) 4 MG disintegrating tablet Take 4 mg by mouth every 8 (eight) hours as needed for nausea or vomiting.    [provider]  rosuvastatin (CRESTOR) 10 MG tablet Take 10 mg by mouth daily.    [provider]  Spacer/Aero-Holding Chambers (AEROCHAMBER PLUS WITH MASK) inhaler Use as instructed 06/15/18   Harris, Abigail, PA-C  topiramate (TOPAMAX) 100 MG tablet Take 100 mg by mouth at bedtime.    [provider]  torsemide (DEMADEX) 20 MG tablet Take 40 mg by mouth daily.    [provider]    Allergies: Tramadol, Memantine, Metformin, Penicillins, Bactoshield chg [chlorhexidine gluconate], Contrast media [iodinated contrast media], Aspirin, Fentanyl, and Hydrocodone-acetaminophen     Review of Systems  Constitutional:  Negative for activity change, appetite change and fever.  HENT:  Negative for congestion.   Respiratory:   Negative for cough and shortness of breath.   Cardiovascular:  Negative for chest pain.  Gastrointestinal:  Negative for abdominal pain.  Genitourinary:  Negative for dysuria and hematuria.  Neurological:  Positive for weakness. Negative for dizziness and headaches.    all other systems are negative except as noted in the HPI and PMH.   Updated Vital Signs BP (!) 185/134 (BP Location: Left Wrist)   Pulse 71   Temp (!) 97.3 F (36.3 C) (Oral)   Resp 19   Ht 5' 4 (1.626 m)   Wt 99.8 kg   SpO2 99%   BMI 37.76 kg/m   Physical Exam Vitals and nursing note reviewed.  Constitutional:      General: She is not in acute distress.    Appearance: She is well-developed.  HENT:     Head: Normocephalic and atraumatic.  Mouth/Throat:     Pharynx: No oropharyngeal exudate.  Eyes:     Conjunctiva/sclera: Conjunctivae normal.     Pupils: Pupils are equal, round, and reactive to light.  Neck:     Comments: No meningismus. Cardiovascular:     Rate and Rhythm: Normal rate and regular rhythm.     Heart sounds: Normal heart sounds. No murmur heard. Pulmonary:     Effort: Pulmonary effort is normal. No respiratory distress.     Breath sounds: Normal breath sounds.  Chest:     Chest wall: No tenderness.  Abdominal:     Palpations: Abdomen is soft.     Tenderness: There is no abdominal tenderness. There is no guarding or rebound.  Musculoskeletal:        General: Tenderness present. Normal range of motion.     Cervical back: Normal range of motion and neck supple.     Comments: Paraspinal lumbar tenderness bilaterally.  No midline tenderness equal strength in lower extremities.  Intact DP and PT pulses bilaterally  Skin:    General: Skin is warm.  Neurological:     Mental Status: She is alert and oriented to person, place, and time.     Cranial Nerves: No cranial nerve deficit.     Motor: No abnormal muscle tone.     Coordination: Coordination normal.     Comments:  5/5 strength  throughout. CN 2-12 intact.Equal grip strength.   Psychiatric:        Behavior: Behavior normal.     (all labs ordered are listed, but only abnormal results are displayed) Labs Reviewed  CBC WITH DIFFERENTIAL/PLATELET - Abnormal; Notable for the following components:      Result Value   RDW 20.8 (*)    All other components within normal limits  COMPREHENSIVE METABOLIC PANEL WITH GFR - Abnormal; Notable for the following components:   Potassium 5.7 (*)    Chloride 95 (*)    Glucose, Bld 194 (*)    BUN 41 (*)    Creatinine, Ser 7.77 (*)    GFR, Estimated 5 (*)    Anion gap 16 (*)    All other components within normal limits  BRAIN NATRIURETIC PEPTIDE - Abnormal; Notable for the following components:   B Natriuretic Peptide 419.6 (*)    All other components within normal limits  TROPONIN I (HIGH SENSITIVITY) - Abnormal; Notable for the following components:   Troponin I (High Sensitivity) 19 (*)    All other components within normal limits  HEPATITIS B SURFACE ANTIGEN  HEPATITIS B SURFACE ANTIBODY, QUANTITATIVE  TROPONIN I (HIGH SENSITIVITY)    EKG: EKG Interpretation Date/Time:  Saturday October 28 2024 00:40:24 EDT Ventricular Rate:  71 PR Interval:  170 QRS Duration:  103 QT Interval:  439 QTC Calculation: 478 R Axis:   -64  Text Interpretation: Sinus rhythm LAD, consider left anterior fascicular block Low voltage, precordial leads Consider anterior infarct No significant change was found Confirmed by Carita Senior [phone]) on 10/28/2024 12:47:16 AM  Radiology: [facility] Chest 2 View Result Date: 10/28/2024 EXAM: AP AND LATERAL (2) VIEW(S) XRAY OF THE CHEST 10/28/2024 03:58:00 AM COMPARISON: AP and lateral radiographs of the chest dated 06/29/2023. CLINICAL HISTORY: sob FINDINGS: LUNGS AND PLEURA: No focal pulmonary opacity. No pulmonary edema. No pleural effusion. No pneumothorax. HEART AND MEDIASTINUM: Cardiomegaly. Calcified aorta. BONES AND SOFT TISSUES: No acute osseous  abnormality. IMPRESSION: 1. Cardiomegaly and calcified aorta. Electronically signed by: Evalene Coho MD 10/28/2024 04:11 AM EDT RP Workstation:  [id number]   MR LUMBAR SPINE WO CONTRAST Result Date: 10/28/2024 CLINICAL DATA:  Initial evaluation for acute low back pain, cauda equina syndrome. EXAM: MRI LUMBAR SPINE WITHOUT CONTRAST TECHNIQUE: Multiplanar, multisequence MR imaging of the lumbar spine was performed. No intravenous contrast was administered. COMPARISON:  None Available. FINDINGS: Segmentation: Standard. Lowest well-formed disc space labeled the L5-S1 level. Alignment: 3 mm facet mediated anterolisthesis of L4 on L5 and L5 on S1. Alignment otherwise normal preservation of the normal lumbar lordosis. Vertebrae: Vertebral body height maintained without acute or chronic fracture. Diffusely decreased T1 signal intensity throughout the visualized bone marrow, suspected be related to patient history of end-stage renal disease. No worrisome osseous lesions. No significant abnormal marrow edema. Conus medullaris and cauda equina: Conus extends to the L1-2 level. Conus and cauda equina appear normal. Paraspinal and other soft tissues: Diffuse fluid signal intensity within the subcutaneous fat of lower back, likely related to overall volume status. Kidneys are markedly atrophic bilaterally with a few small T2 hyperintense cysts, benign in appearance, no follow-up imaging recommended. Disc levels: L1-2: Disc desiccation with minimal annular disc bulge. Mild facet hypertrophy. No canal stenosis. Foramina remain patent. L2-3: Disc desiccation with mild diffuse disc bulge. Mild bilateral facet hypertrophy. No significant spinal stenosis. Foramina remain patent. L3-4: Mild intervertebral disc space narrowing with disc desiccation and diffuse disc bulge. Reactive endplate spurring. Mild bilateral facet hypertrophy. No significant spinal stenosis. Mild to moderate bilateral L3 foraminal narrowing. L4-5: Trace  anterolisthesis. Disc desiccation with diffuse disc bulge. Reactive endplate spurring. Moderate bilateral facet arthrosis. Resultant mild narrowing of the lateral recesses bilaterally. Central canal remains patent. Moderate right with mild to moderate left L4 foraminal stenosis. L5-S1: Trace anterolisthesis. Disc desiccation without disc bulge. Severe bilateral facet arthrosis. No significant spinal stenosis. Mild right L5 foraminal narrowing. Left neural foramina remains patent. IMPRESSION: 1. No acute abnormality within the lumbar spine. No evidence for cord compression or high-grade spinal stenosis. 2. Multifactorial degenerative changes at L4-5 with resultant mild bilateral lateral recess stenosis, with moderate right and mild to moderate left L4 foraminal narrowing. 3. Mild to moderate bilateral L3 foraminal stenosis related to disc bulge and facet hypertrophy. 4. Severe bilateral facet arthrosis at L5-S1 with associated mild right L5 foraminal stenosis. Electronically Signed   By: Morene Hoard M.D.   On: 10/28/2024 01:40     Procedures   Medications Ordered in the ED - No data to display                                  Medical Decision Making Amount and/or Complexity of Data Reviewed Labs: ordered. Decision-making details documented in ED Course. Radiology: ordered and independent interpretation performed. Decision-making details documented in ED Course. ECG/medicine tests: ordered and independent interpretation performed. Decision-making details documented in ED Course.  Risk Prescription drug management. Decision regarding hospitalization.   Low back pain with weakness. Did not have dialysis today. No CP or SOB.equal strength in lower extremities. Low concern for cord compression or cauda equina. Does not make urine. No fever.   Intact distal strength, pulses and reflexes.  Hyperkalemic 5.7.  Hypertensive 210/109.  Did not take her blood pressure medication last night.   Denies chest pain or shortness of breath.  Initially had prolonged QT which has since improved.  Did not have dialysis yesterday.  Increasing low back pain with generalized weakness and difficulty ambulating.  MRI shows no acute findings or  cord compression or cauda equina.  Does have foraminal stenosis and degenerative changes throughout. IMPRESSION: 1. No acute abnormality within the lumbar spine. No evidence for cord compression or high-grade spinal stenosis. 2. Multifactorial degenerative changes at L4-5 with resultant mild bilateral lateral recess stenosis, with moderate right and mild to moderate left L4 foraminal narrowing. 3. Mild to moderate bilateral L3 foraminal stenosis related to disc bulge and facet hypertrophy. 4. Severe bilateral facet arthrosis at L5-S1 with associated mild right L5 foraminal stenosis.  Given her worsening back pain with difficulty walking would benefit from likely PT and OT.  She is hypertensive and given her home blood pressure medications.  She is given Lokelma for her hyperkalemia.  Does not have any significant EKG changes.  Would benefit from dialysis in the morning.  Admission discussed with Dr. Dena.     Final diagnoses:  Hypertensive urgency  Hyperkalemia    ED Discharge Orders     None          Courtlynn Holloman, Garnette, MD 10/28/24 1041

## 2024-10-28 NOTE — Progress Notes (Signed)
   10/28/24 2354  BiPAP/CPAP/SIPAP  BiPAP/CPAP/SIPAP Pt Type Adult  BiPAP/CPAP/SIPAP Resmed  Mask Type Full face mask  Mask Size Medium  EPAP 5 cmH2O  FiO2 (%) 28 %  Flow Rate 2 lpm  Patient Home Machine No  Patient Home Mask No  Patient Home Tubing No  Auto Titrate No  Device Plugged into RED Power Outlet Yes  BiPAP/CPAP /SiPAP Vitals  Pulse Rate 63  SpO2 97 %  Bilateral Breath Sounds Clear

## 2024-10-28 NOTE — ED Notes (Signed)
 Report given to dialysis RN

## 2024-10-28 NOTE — Progress Notes (Signed)
 Tx tolerated well, Bp was elevated throughout tx, but pt was asymptomatic.   10/28/24 1711  Vitals  Temp 98.4 F (36.9 C)  BP (!) 194/82  Pulse Rate 61  Resp 16  Oxygen Therapy  SpO2 99 %  O2 Device Room Air  Post Treatment  Dialyzer Clearance Clear  Liters Processed 76.8  Fluid Removed (mL) 2500 mL  Tolerated HD Treatment Yes  AVG/AVF Arterial Site Held (minutes) 5 minutes  AVG/AVF Venous Site Held (minutes) 5 minutes

## 2024-10-28 NOTE — H&P (Addendum)
 [facility] H&P   Patient Demographics:    Patricia Bradley, is a 73 y.o. female  MRN: [id number]   DOB - [birth year]  Admit Date - 10/27/2024  Outpatient Primary MD for the patient is Altidor, Jayson, NP    Chief Complaint  Patient presents with   Weakness      HPI:    Patricia Bradley  is a 73 y.o. female,  with hx of ESRD on MWF, PAD, history of chronic lower extremity wounds and followed at the wound clinic, history of limb ischemia, uncontrolled hypertension with history of hypertensive urgency, OSA, diabetic polyneuropathy, obesity, emphysema, chronic debility and uses a walker for ambulation, with multiple recent admissions to atrium at [facility], 3 over the last couple month, she was just discharged to [facility] 10/02/2024, and she was just discharged from subacute rehab yesterday, apparently she missed her dialysis yesterday, she presents to ED again with weakness, deconditioning.  She does not make urine, no bowel incontinence, no new weakness in her legs, no new tingling, numbness, no dyspnea or chest pain - In ED her workup significant for elevated blood pressure maxed 210/109, but this has improved with resuming her home regimen, chest x-ray significant for cardiomegaly, labs significant for potassium 5.7, creatinine at 7.77, BUN at 41, BNP elevated at 419, troponins reassuring at 19> 15, MRI with no acute finding concerning for cord compression or high-grade spinal stenosis, [facility] hospitalist consulted to admit   Review of systems:      A full 10 point Review of Systems was done, except as stated above, all other Review of Systems were negative.   With Past History of the following :    Past Medical History:  Diagnosis Date   Diabetes mellitus without complication (HCC)    Hypertension    Renal disorder       History reviewed. No pertinent surgical history.    Social  History:     Social History   Tobacco Use   Smoking status: Never   Smokeless tobacco: Never  Substance Use Topics   Alcohol use: Never       Family History :    History reviewed. No pertinent family history.    Home Medications:   Prior to Admission medications   Medication Sig Start Date End Date Taking? Authorizing Provider  albuterol  (PROVENTIL  HFA;VENTOLIN  HFA) 108 (90 Base) MCG/ACT inhaler Inhale 2 puffs into the lungs every 6 (six) hours as needed for wheezing or shortness of breath.    [provider]  albuterol  (PROVENTIL  HFA;VENTOLIN  HFA) 108 (90 Base) MCG/ACT inhaler Inhale 2 puffs into the lungs every 4 (four) hours as needed for wheezing or shortness of breath. 06/15/18   Harris, Abigail, PA-C  ALPRAZolam (XANAX) 0.25 MG tablet Take 0.25 mg by mouth daily as needed for anxiety.    [provider]  amLODipine (NORVASC) 5 MG tablet Take 5 mg  by mouth daily.    [provider]  aspirin EC 81 MG tablet Take 81 mg by mouth daily.    [provider]  benzonatate  (TESSALON ) 100 MG capsule Take 2 capsules (200 mg total) by mouth 2 (two) times daily as needed for cough. 06/15/18   Harris, Abigail, PA-C  calcium acetate (PHOSLO) 667 MG capsule Take 667-1,334 mg by mouth See admin instructions. Take 1334mg  three times a day after meals, Take 667mg  after snacks    [provider]  cetirizine (ZYRTEC) 10 MG tablet Take 5 mg by mouth daily.    [provider]  Cholecalciferol 2000 units CAPS Take 2,000 Units by mouth daily.    [provider]  clobetasol ointment (TEMOVATE) 0.05 % Apply 1 application topically daily. To feet    [provider]  clotrimazole-betamethasone (LOTRISONE) lotion Apply 1 application topically 2 (two) times daily.    [provider]  esomeprazole (NEXIUM) 40 MG capsule Take 40 mg by mouth daily at 12 noon.    [provider]  famotidine  (PEPCID ) 20 MG tablet Take 1 tablet  (20 mg total) by mouth 2 (two) times daily. 06/15/18   Harris, Abigail, PA-C  febuxostat (ULORIC) 40 MG tablet Take 40 mg by mouth daily.    [provider]  fluticasone (FLONASE) 50 MCG/ACT nasal spray Place 1 spray into both nostrils daily.    [provider]  Fluticasone-Salmeterol (ADVAIR) 100-50 MCG/DOSE AEPB Inhale 1 puff into the lungs 2 (two) times daily.    [provider]  gabapentin (NEURONTIN) 300 MG capsule Take 300 mg by mouth at bedtime.    [provider]  hydrocortisone 2.5 % cream Apply 1 application topically 2 (two) times daily.    [provider]  Insulin Glargine (BASAGLAR KWIKPEN) 100 UNIT/ML SOPN Inject 10 Units into the skin at bedtime.    [provider]  insulin lispro (HUMALOG KWIKPEN) 100 UNIT/ML KiwkPen Inject 3 Units into the skin 3 (three) times daily. Before meals    [provider]  ipratropium (ATROVENT) 0.06 % nasal spray Place 2 sprays into both nostrils 3 (three) times daily.    [provider]  isosorbide mononitrate (IMDUR) 60 MG 24 hr tablet Take 60 mg by mouth daily.    [provider]  lidocaine (XYLOCAINE) 5 % ointment Apply 1 application topically daily as needed for mild pain.    [provider]  metoprolol succinate (TOPROL-XL) 50 MG 24 hr tablet Take 50 mg by mouth daily. Take with or immediately following a meal.    [provider]  Multiple Vitamins-Minerals (CENTRUM SILVER 50+WOMEN) TABS Take 1 tablet by mouth daily.    [provider]  Naftifine HCl (NAFTIN) 2 % CREA Apply 1 g topically 2 (two) times daily. Starting at feet    [provider]  nitroGLYCERIN (NITROSTAT) 0.4 MG SL tablet Place 0.4 mg under the tongue every 5 (five) minutes as needed for chest pain.    [provider]  NON FORMULARY CPAP Nightly    [provider]  omega-3 fish oil (MAXEPA) 1000 MG CAPS capsule Take 1 capsule by mouth daily.    [provider]  ondansetron (ZOFRAN-ODT) 4 MG disintegrating tablet Take 4 mg by mouth every 8 (eight) hours as needed for nausea or vomiting.    [provider]  rosuvastatin (CRESTOR) 10 MG tablet Take 10 mg by mouth daily.    [provider]  Spacer/Aero-Holding Chambers (AEROCHAMBER  PLUS WITH MASK) inhaler Use as instructed 06/15/18   Harris, Abigail, PA-C  topiramate (TOPAMAX) 100 MG tablet Take 100 mg by mouth at bedtime.    [provider]  torsemide (DEMADEX) 20 MG tablet Take 40 mg by mouth daily.    [provider]     Allergies:     Allergies  Allergen Reactions   Tramadol Other (See Comments)    Seizures.    Memantine Other (See Comments)     Dizziness ,Headache     Metformin Diarrhea   Penicillins Other (See Comments)    Has patient had a PCN reaction causing immediate rash, facial/tongue/throat swelling, SOB or lightheadedness with hypotension: Unknown Has patient had a PCN reaction causing severe rash involving mucus membranes or skin necrosis: Unknown Has patient had a PCN reaction that required hospitalization: Unknown Has patient had a PCN reaction occurring within the last 10 years: Unknown If all of the above answers are NO, then may proceed with Cephalosporin use.     Bactoshield Chg [Chlorhexidine Gluconate] Itching    Wipes    Contrast Media [Iodinated Contrast Media] Hives and Itching    Advised to never taken contrast again    Aspirin Other (See Comments)     EPISTAXIS Nosebleed, can tolerate low dose    Fentanyl Itching    Patch.     Hydrocodone-Acetaminophen  Nausea Only     Physical Exam:   Vitals  Blood pressure (!) 189/80, pulse 69, temperature 97.8 F (36.6 C), temperature source Oral, resp. rate (!) 21, height 5' 4 (1.626 m), weight 99.8 kg, SpO2 98%.   1. General Juliane female, laying in bed, no apparent distress  2.  Awake, alert x 3, no apparent distress  3. No F.N deficits, ALL C.Nerves  Intact, overall motor grossly intact  4. Ears and Eyes appear Normal, Conjunctivae clear, PERRLA. Moist Oral Mucosa.  5. Supple Neck, No JVD, No cervical lymphadenopathy appriciated, No Carotid Bruits.  6. Symmetrical Chest wall movement, Good air movement bilaterally, CTAB.  7. RRR, No Gallops, Rubs or Murmurs, No Parasternal Heave.  8. Positive Bowel Sounds, Abdomen Soft, No tenderness, No organomegaly appriciated,No rebound -guarding or rigidity.  9.  No Cyanosis, Normal Skin Turgor, No Skin Rash or Bruise.  10. joints appear normal , no effusions     Data Review:    CBC Recent Labs  Lab 10/28/24 0032  WBC 8.0  HGB 12.3  HCT 40.3  PLT 227  MCV 88.4  MCH 27.0  MCHC 30.5  RDW 20.8*  LYMPHSABS 1.4  MONOABS 0.4  EOSABS 0.2  BASOSABS 0.1   ------------------------------------------------------------------------------------------------------------------  Chemistries  Recent Labs  Lab 10/28/24 0032  NA 136  K 5.7*  CL 95*  CO2 25  GLUCOSE 194*  BUN 41*  CREATININE 7.77*  CALCIUM 9.1  AST 22  ALT 12  ALKPHOS 88  BILITOT 0.8   ------------------------------------------------------------------------------------------------------------------ estimated creatinine clearance is 7.2 mL/min (A) (by C-G formula based on SCr of 7.77 mg/dL (H)). ------------------------------------------------------------------------------------------------------------------ No results for input(s): TSH, T4TOTAL, T3FREE, THYROIDAB in the last 72 hours.  Invalid input(s): FREET3  Coagulation profile No results for input(s): INR, PROTIME in the last 168 hours. ------------------------------------------------------------------------------------------------------------------- No results for input(s): DDIMER in the last 72 hours. -------------------------------------------------------------------------------------------------------------------  Cardiac Enzymes No  results for input(s): CKMB, TROPONINI, MYOGLOBIN in the last 168 hours.  Invalid input(s): CK ------------------------------------------------------------------------------------------------------------------    Component Value Date/Time   BNP 419.6 (H) 10/28/2024 0036     ---------------------------------------------------------------------------------------------------------------  Urinalysis  No results found for: COLORURINE, APPEARANCEUR, LABSPEC, PHURINE, GLUCOSEU, HGBUR, BILIRUBINUR, KETONESUR, PROTEINUR, UROBILINOGEN, NITRITE, LEUKOCYTESUR  ----------------------------------------------------------------------------------------------------------------   Imaging Results:    DG Chest 2 View Result Date: 10/28/2024 EXAM: AP AND LATERAL (2) VIEW(S) XRAY OF THE CHEST 10/28/2024 03:58:00 AM COMPARISON: AP and lateral radiographs of the chest dated 06/29/2023. CLINICAL HISTORY: sob FINDINGS: LUNGS AND PLEURA: No focal pulmonary opacity. No pulmonary edema. No pleural effusion. No pneumothorax. HEART AND MEDIASTINUM: Cardiomegaly. Calcified aorta. BONES AND SOFT TISSUES: No acute osseous abnormality. IMPRESSION: 1. Cardiomegaly and calcified aorta. Electronically signed by: Evalene Coho MD 10/28/2024 04:11 AM EDT RP Workstation: [id number]   MR LUMBAR SPINE WO CONTRAST Result Date: 10/28/2024 CLINICAL DATA:  Initial evaluation for acute low back pain, cauda equina syndrome. EXAM: MRI LUMBAR SPINE WITHOUT CONTRAST TECHNIQUE: Multiplanar, multisequence MR imaging of the lumbar spine was performed. No intravenous contrast was administered. COMPARISON:  None Available. FINDINGS: Segmentation: Standard. Lowest well-formed disc space labeled the L5-S1 level. Alignment: 3 mm facet mediated anterolisthesis of L4 on L5 and L5 on S1. Alignment otherwise normal preservation of the normal lumbar lordosis. Vertebrae: Vertebral body height maintained without acute or  chronic fracture. Diffusely decreased T1 signal intensity throughout the visualized bone marrow, suspected be related to patient history of end-stage renal disease. No worrisome osseous lesions. No significant abnormal marrow edema. Conus medullaris and cauda equina: Conus extends to the L1-2 level. Conus and cauda equina appear normal. Paraspinal and other soft tissues: Diffuse fluid signal intensity within the subcutaneous fat of lower back, likely related to overall volume status. Kidneys are markedly atrophic bilaterally with a few small T2 hyperintense cysts, benign in appearance, no follow-up imaging recommended. Disc levels: L1-2: Disc desiccation with minimal annular disc bulge. Mild facet hypertrophy. No canal stenosis. Foramina remain patent. L2-3: Disc desiccation with mild diffuse disc bulge. Mild bilateral facet hypertrophy. No significant spinal stenosis. Foramina remain patent. L3-4: Mild intervertebral disc space narrowing with disc desiccation and diffuse disc bulge. Reactive endplate spurring. Mild bilateral facet hypertrophy. No significant spinal stenosis. Mild to moderate bilateral L3 foraminal narrowing. L4-5: Trace anterolisthesis. Disc desiccation with diffuse disc bulge. Reactive endplate spurring. Moderate bilateral facet arthrosis. Resultant mild narrowing of the lateral recesses bilaterally. Central canal remains patent. Moderate right with mild to moderate left L4 foraminal stenosis. L5-S1: Trace anterolisthesis. Disc desiccation without disc bulge. Severe bilateral facet arthrosis. No significant spinal stenosis. Mild right L5 foraminal narrowing. Left neural foramina remains patent. IMPRESSION: 1. No acute abnormality within the lumbar spine. No evidence for cord compression or high-grade spinal stenosis. 2. Multifactorial degenerative changes at L4-5 with resultant mild bilateral lateral recess stenosis, with moderate right and mild to moderate left L4 foraminal narrowing. 3. Mild to  moderate bilateral L3 foraminal stenosis related to disc bulge and facet hypertrophy. 4. Severe bilateral facet arthrosis at L5-S1 with associated mild right L5 foraminal stenosis. Electronically Signed   By: Morene Hoard M.D.   On: 10/28/2024 01:40     EKG:   Vent. 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 - Repeat MRI lumbar spine with no acute abnormality within the lumbar spine, no evidence of cord compression or high grade spinal stenosis -Will consult PT, OT, likely will need assistance with placement.  ESRD Hyperkalemia -She is hemodialysis Monday Wednesday Friday, she missed her HD yesterday -Renal consulted -Received Lokelma  Hypertensive urgency Hx of renal artery stenosis -blood pressure 210/109 on presentation, likely as she has not been taking her home meds - Resume home regimen and continue with as needed hydralazine - Blood pressure should improve with dialysis - Will resume amlodipine 2.5 mg p.o. twice daily, Coreg 25 mg p.o. twice daily (from tomorrow as she already received 100 mg of Toprol-XL in ED), continue with home dose Imdur, I will hold on resuming losartan with hyperkalemia  Anemia of chronic kidney disease -hemoglobin at baseline  - Recent workup at [facility] including endoscopy and  colonoscopy with no evidence of active bleed - Iron and Procrit per renal   PAD with critical limb ischemia s/p left and right SFA/PTA angioplasty/stent - Follows with vascular surgery as outpatient. - Continue with DAPT and statin   Diabetes Mellitus Type 2 - Keep on insulin sliding scale  Gout - Continue with home allopurinol  COPD - No active wheezing, continue with as needed nebs  Patient medication reconciliation still pending, recently discharged from SNF with her medication list is unclear yet, so compared most of her meds from her discharge summary on 10/02/2024 from [facility].   DVT Prophylaxis Heparin  AM Labs Ordered, also please review Full Orders  Family Communication: Admission, patients condition and plan of care including tests being ordered have been discussed with the patient and daughter cell by phone who indicate understanding and agree with the plan and Code Status.  Code Status full code  Likely DC to likely will need SNF  Consults called: Renal  Admission status: Inpatient  Time spent in minutes : 70 minutes   Brayton Lye M.D on 10/28/2024 at 7:18 AM   [facility] - Office  [phone]

## 2024-10-28 NOTE — Consult Note (Signed)
 Renal Service Consult Note [facility]ar JONETTA Fret, MD  Patient: Patricia Bradley Date: 10/28/2024 Requesting Physician: Dr. Sherlon  Reason for Consult: ESRD pt with gen'd weakness, unable to ambulate HPI: The patient is a 73 y.o. year-old w/ PMH as below who presented to ED from dtr's home. Pt was dc'd from SNF recently and could not ambulate to get to her HD appt on 10/31 (MWF). Also having back pains. In ED BP 202/72, HR 70, RR 17-22, temp 97.3.  K+ 5.7, BUN 41, creat 7.77, Alb 3.6, BNP 419, trop 19 / 15. CXR showed no acute disease. Pt is being admitted. We are asked to see for dialysis.   Pt seen in ED room. Pt upset about not having dialysis this morning, they said they would do it this morning!. Denies any SOB, CP. Minor L ankle swelling w/ prior hx of surgery on that foot.   ROS - denies CP, no joint pain, no HA, no blurry vision, no rash, no diarrhea, no nausea/ vomiting   Past Medical History  Past Medical History:  Diagnosis Date   Diabetes mellitus without complication (HCC)    Hypertension    Renal disorder    Past Surgical History History reviewed. No pertinent surgical history. Family History History reviewed. No pertinent family history. Social History  reports that she has never smoked. She has never used smokeless tobacco. She reports that she does not drink alcohol and does not use drugs. Allergies  Allergies  Allergen Reactions   Tramadol Other (See Comments)    Seizures.    Memantine Other (See Comments)     Dizziness ,Headache     Metformin Diarrhea   Penicillins Other (See Comments)    Has patient had a PCN reaction causing immediate rash, facial/tongue/throat swelling, SOB or lightheadedness with hypotension: Unknown Has patient had a PCN reaction causing severe rash involving mucus membranes or skin necrosis: Unknown Has patient had a PCN reaction that required hospitalization: Unknown Has patient had a PCN reaction occurring  within the last 10 years: Unknown If all of the above answers are NO, then may proceed with Cephalosporin use.     Bactoshield Chg [Chlorhexidine Gluconate] Itching    Wipes    Contrast Media [Iodinated Contrast Media] Hives and Itching    Advised to never taken contrast again    Aspirin Other (See Comments)     EPISTAXIS Nosebleed, can tolerate low dose    Fentanyl Itching    Patch.     Hydrocodone-Acetaminophen  Nausea Only   Home medications Prior to Admission medications   Medication Sig Start Date End Date Taking? Authorizing Provider  albuterol  (PROVENTIL  HFA;VENTOLIN  HFA) 108 (90 Base) MCG/ACT inhaler Inhale 2 puffs into the lungs every 6 (six) hours as needed for wheezing or shortness of breath.    [provider]  allopurinol (ZYLOPRIM) 100 MG tablet Take 100 mg by mouth daily.    [provider]  ALPRAZolam (XANAX) 0.25 MG tablet Take 0.25 mg by mouth daily as needed for anxiety.    [provider]  amLODipine (NORVASC) 2.5 MG tablet Take 2.5 mg by mouth daily.    [provider]  amLODipine (NORVASC) 5 MG tablet Take 5 mg by mouth daily.    [provider]  aspirin EC 81 MG tablet Take 81 mg by mouth daily.    [provider]  atorvastatin (LIPITOR) 20 MG tablet Take 20 mg by mouth at bedtime.    [provider]  benzonatate  (TESSALON ) 100 MG capsule Take 2 capsules (200 mg total) by mouth 2 (two) times daily as needed for cough. 06/15/18   Harris, Abigail, PA-C  calcium acetate (PHOSLO) 667 MG capsule Take 667-1,334 mg by mouth See admin instructions. Take 1334mg  three times a day after meals, Take 667mg  after snacks    [provider]  carvedilol (COREG) 25 MG tablet Take 25 mg by mouth 3 (three) times daily. 08/08/24   [provider]  cetirizine (ZYRTEC) 10 MG tablet Take 5 mg by mouth daily.    [provider]  Cholecalciferol 2000 units CAPS Take 2,000 Units by mouth daily.     [provider]  clobetasol ointment (TEMOVATE) 0.05 % Apply 1 application topically daily. To feet    [provider]  clopidogrel (PLAVIX) 75 MG tablet Take 75 mg by mouth daily.    [provider]  clotrimazole-betamethasone (LOTRISONE) lotion Apply 1 application topically 2 (two) times daily.    [provider]  donepezil (ARICEPT) 10 MG tablet Take 10 mg by mouth daily. 02/04/23   [provider]  esomeprazole (NEXIUM) 40 MG capsule Take 40 mg by mouth daily at 12 noon.    [provider]  famotidine  (PEPCID ) 20 MG tablet Take 1 tablet (20 mg total) by mouth 2 (two) times daily. 06/15/18   Harris, Abigail, PA-C  febuxostat (ULORIC) 40 MG tablet Take 40 mg by mouth daily.    [provider]  fluticasone (FLONASE) 50 MCG/ACT nasal spray Place 1 spray into both nostrils daily.    [provider]  Fluticasone-Salmeterol (ADVAIR) 100-50 MCG/DOSE AEPB Inhale 1 puff into the lungs 2 (two) times daily.    [provider]  gabapentin (NEURONTIN) 300 MG capsule Take 300 mg by mouth at bedtime.    [provider]  glipiZIDE (GLUCOTROL XL) 5 MG 24 hr tablet Take 5 mg by mouth.    [provider]  hydrocortisone 2.5 % cream Apply 1 application topically 2 (two) times daily.    [provider]  hydrOXYzine (ATARAX) 50 MG tablet Take 50 mg by mouth. 09/20/24   [provider]  Insulin Glargine (BASAGLAR KWIKPEN) 100 UNIT/ML SOPN Inject 10 Units into the skin at bedtime.    [provider]  insulin lispro (HUMALOG KWIKPEN) 100 UNIT/ML KiwkPen Inject 3 Units into the skin 3 (three) times daily. Before meals    [provider]  ipratropium (ATROVENT) 0.06 % nasal spray Place 2 sprays into both nostrils 3 (three) times daily.    [provider]  isosorbide mononitrate (IMDUR) 30 MG 24 hr tablet Take 30 mg by mouth daily.    [provider]  isosorbide mononitrate  (IMDUR) 60 MG 24 hr tablet Take 60 mg by mouth daily.    [provider]  lidocaine (XYLOCAINE) 5 % ointment Apply 1 application topically daily as needed for mild pain.    [provider]  losartan (COZAAR) 25 MG tablet Take 25 mg by mouth daily. 10/19/24   [provider]  metoprolol succinate (TOPROL-XL) 50 MG 24 hr tablet Take 50 mg by mouth daily. Take with or immediately following a meal.    [provider]  MOUNJARO 5 MG/0.5ML Pen Inject 5 mg into the skin once a week. 10/19/24   [provider]  Multiple Vitamins-Minerals (CENTRUM SILVER 50+WOMEN) TABS Take 1 tablet by mouth daily.    [provider]  Naftifine HCl (NAFTIN) 2 % CREA Apply 1  g topically 2 (two) times daily. Starting at feet    [provider]  nitroGLYCERIN (NITROSTAT) 0.4 MG SL tablet Place 0.4 mg under the tongue every 5 (five) minutes as needed for chest pain.    [provider]  NON FORMULARY CPAP Nightly    [provider]  omega-3 fish oil (MAXEPA) 1000 MG CAPS capsule Take 1 capsule by mouth daily.    [provider]  omeprazole (PRILOSEC) 20 MG capsule Take 20 mg by mouth daily. 08/01/21 12/06/24  [provider]  ondansetron (ZOFRAN-ODT) 4 MG disintegrating tablet Take 4 mg by mouth every 8 (eight) hours as needed for nausea or vomiting.    [provider]  rosuvastatin (CRESTOR) 10 MG tablet Take 10 mg by mouth daily.    [provider]  topiramate (TOPAMAX) 100 MG tablet Take 100 mg by mouth at bedtime.    [provider]  torsemide (DEMADEX) 20 MG tablet Take 40 mg by mouth daily.    [provider]     Vitals:   10/28/24 0317 10/28/24 0459 10/28/24 0745 10/28/24 0815  BP: (!) 210/109 (!) 189/80 (!) 177/79 (!) 181/74  Pulse:  69 64 64  Resp:  (!) 21 15 17   Temp:  97.8 F (36.6 C)  97.9 F (36.6 C)  TempSrc:  Oral  Oral  SpO2:  98% 100% 99%  Weight:      Height:        Exam Gen alert, no distress, on RA Sclera anicteric, throat clear  No jvd or bruits Chest clear bilat to bases RRR no MRG Abd soft ntnd no mass or ascites +bs Ext trace L ankle edema, no other edema Neuro is alert, Ox 3 , nf    RUA AVF+bruit   Home bp meds: Norvasc 2.5mg  every day Coreg 25mg  tid Imdur 30-60 mg every day Losartan 25 mg every day Metoprolol xl 50mg  every day Torsemide 40mg  every day    OP HD: MWF HD Dr Erskin /  [facility] 3h   B400  95.5kg  AVF Heparin not sure    Assessment/ Plan: Generalized weakness: just dc'd from SNF facility. Per pmd.  ESRD: on HD MWF. Missed HD yesterday. Plan HD today upstairs.  HTN: is on multiple home BP lowering meds. Re-introduce home BP meds prn per pmd.  Volume: euvolemic on exam, UF goal 2-2.5 L as tolerated.  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   Myer Fret  MD [facility] 10/28/2024, 10:33 AM  Recent Labs  Lab 10/28/24 0032  HGB 12.3  ALBUMIN 3.6  CALCIUM 9.1  CREATININE 7.77*  K 5.7*   Inpatient medications:  heparin  5,000 Units Subcutaneous Q8H   isosorbide mononitrate  60 mg Oral Daily   metoprolol succinate  50 mg Oral Daily   sodium zirconium cyclosilicate  10 g Oral Once    acetaminophen  **OR** acetaminophen , hydrALAZINE

## 2024-10-29 DIAGNOSIS — I160 Hypertensive urgency: Secondary | ICD-10-CM | POA: Diagnosis not present

## 2024-10-29 DIAGNOSIS — E875 Hyperkalemia: Secondary | ICD-10-CM | POA: Diagnosis not present

## 2024-10-29 LAB — CBC
HCT: 41.3 % (ref 36.0–46.0)
Hemoglobin: 12.7 g/dL (ref 12.0–15.0)
MCH: 27.1 pg (ref 26.0–34.0)
MCHC: 30.8 g/dL (ref 30.0–36.0)
MCV: 88.2 fL (ref 80.0–100.0)
Platelets: 199 K/uL (ref 150–400)
RBC: 4.68 MIL/uL (ref 3.87–5.11)
RDW: 19.9 % — ABNORMAL HIGH (ref 11.5–15.5)
WBC: 4.8 K/uL (ref 4.0–10.5)
nRBC: 0 % (ref 0.0–0.2)

## 2024-10-29 LAB — BASIC METABOLIC PANEL WITH GFR
Anion gap: 13 (ref 5–15)
BUN: 28 mg/dL — ABNORMAL HIGH (ref 8–23)
CO2: 25 mmol/L (ref 22–32)
Calcium: 8.8 mg/dL — ABNORMAL LOW (ref 8.9–10.3)
Chloride: 98 mmol/L (ref 98–111)
Creatinine, Ser: 6.1 mg/dL — ABNORMAL HIGH (ref 0.44–1.00)
GFR, Estimated: 7 mL/min — ABNORMAL LOW (ref >60)
Glucose, Bld: 97 mg/dL (ref 70–99)
Potassium: 5.1 mmol/L (ref 3.5–5.1)
Sodium: 136 mmol/L (ref 135–145)

## 2024-10-29 LAB — GLUCOSE, CAPILLARY
Glucose-Capillary: 91 mg/dL (ref 70–99)
Glucose-Capillary: 130 mg/dL — ABNORMAL HIGH (ref 70–99)
Glucose-Capillary: 152 mg/dL — ABNORMAL HIGH (ref 70–99)

## 2024-10-29 MED ORDER — CARVEDILOL 25 MG PO TABS: 25.0000 mg | ORAL_TABLET | Freq: Three times a day (TID) | ORAL | 0 refills | Status: DC

## 2024-10-29 MED ORDER — ATORVASTATIN CALCIUM 20 MG PO TABS: 20.0000 mg | ORAL_TABLET | Freq: Every day | ORAL | 0 refills | Status: AC

## 2024-10-29 MED ORDER — AMLODIPINE BESYLATE 2.5 MG PO TABS: 2.5000 mg | ORAL_TABLET | Freq: Every day | ORAL | 0 refills | Status: AC

## 2024-10-29 MED ORDER — ISOSORBIDE MONONITRATE ER 60 MG PO TB24: 60.0000 mg | ORAL_TABLET | Freq: Every day | ORAL | 0 refills | Status: AC

## 2024-10-29 MED ORDER — CARVEDILOL 25 MG PO TABS: 25.0000 mg | ORAL_TABLET | Freq: Two times a day (BID) | ORAL | 0 refills | Status: AC

## 2024-10-29 NOTE — Evaluation (Signed)
 Occupational Therapy Evaluation Patient Details Name: Patricia Bradley MRN: [id number] DOB: [birth year] Today's Date: 10/29/2024   History of Present Illness   73 yo F adm 10/27/24 with weakness, hyperkalemia after missed HD. PMhx: ESRD on HD MWF, PAD, HTN, DM, polyneuropathy, emphysema, spinal stenosis, gout     Clinical Impressions Pt admitted based on above, and was seen based on problem list below. PTA pt was living alone with [facility] aid Mon-Fri, with frequent hospitalizations and snf stays. Today pt is requiring set up  to total assist for ADLs. Bed mobility and functional transfers are  min  assist with RW. Pt with baseline cog deficits, poor standing balance, activity tolerance, and generalized weakness limiting her. Educated daughter via phone call regarding pt's need for 24/7 supervision at d/c. Daughter states preference for home with [facility] and family providing 24/7 supervision and assistance. If family unable to provide, then recommending post-acute rehab stay <3 hours a day in order to arrange LTC plan. OT will continue to follow acutely to maximize functional independence.      If plan is discharge home, recommend the following:   A little help with walking and/or transfers;A lot of help with bathing/dressing/bathroom;Assistance with cooking/housework;Supervision due to cognitive status     Functional Status Assessment   Patient has had a recent decline in their functional status and demonstrates the ability to make significant improvements in function in a reasonable and predictable amount of time.     Equipment Recommendations   None recommended by OT      Precautions/Restrictions   Precautions Precautions: Fall;Other (comment) Recall of Precautions/Restrictions: Impaired Precaution/Restrictions Comments: incontinent bowel Restrictions Weight Bearing Restrictions Per Provider Order: No     Mobility Bed Mobility Overal bed mobility: Needs Assistance Bed  Mobility: Supine to Sit     Supine to sit: Contact guard     General bed mobility comments: HOB 25 degrees with rail, increased time and cues for safety and function    Transfers Overall transfer level: Needs assistance Equipment used: Rolling walker (2 wheels) Transfers: Sit to/from Stand Sit to Stand: Min assist, Contact guard assist           General transfer comment: Cues for hand placement. Min assist to rise from low bed height, CGA from elevated BSC      Balance Overall balance assessment: Needs assistance Sitting-balance support: No upper extremity supported, Feet supported Sitting balance-Leahy Scale: Fair     Standing balance support: Bilateral upper extremity supported, Reliant on assistive device for balance Standing balance-Leahy Scale: Poor Standing balance comment: Rw in standing         ADL either performed or assessed with clinical judgement   ADL Overall ADL's : Needs assistance/impaired Eating/Feeding: Set up;Sitting   Grooming: Set up;Sitting   Upper Body Bathing: Set up;Sitting   Lower Body Bathing: Moderate assistance;Sit to/from stand   Upper Body Dressing : Set up;Sitting   Lower Body Dressing: Moderate assistance;Sit to/from stand   Toilet Transfer: Rolling walker (2 wheels);Minimal assistance Toilet Transfer Details (indicate cue type and reason): Min assist for STS Toileting- Clothing Manipulation and Hygiene: Sit to/from stand;Total assistance Toileting - Clothing Manipulation Details (indicate cue type and reason): Pt reports baseline difficulty with toilet hygiene. Pt completing some aspects of hygiene but requires more assist     Functional mobility during ADLs: Minimal assistance;Rolling walker (2 wheels) General ADL Comments: Limited d/t increased activity tolerance and poor balance     Vision Baseline Vision/History: 0 No visual deficits Patient Visual  Report: No change from baseline Vision Assessment?: No apparent  visual deficits            Pertinent Vitals/Pain Pain Assessment Pain Assessment: No/denies pain     Extremity/Trunk Assessment Upper Extremity Assessment Upper Extremity Assessment: Generalized weakness   Lower Extremity Assessment Lower Extremity Assessment: Defer to PT evaluation   Cervical / Trunk Assessment Cervical / Trunk Assessment: Kyphotic   Communication Communication Communication: No apparent difficulties   Cognition Arousal: Alert Behavior During Therapy: Flat affect Cognition: History of cognitive impairments, No family/caregiver present to determine baseline             OT - Cognition Comments: Pt with hx of dementia. Aware of hospitalizations and rehab stay. Poor problem solving and memory. Overall [facility]                 Following commands: Impaired Following commands impaired: Only follows one step commands consistently     Cueing  General Comments   Cueing Techniques: Verbal cues  Elevated BP throughout session           Home Living Family/patient expects to be discharged to:: Private residence Living Arrangements: Alone Available Help at Discharge: Family;Available PRN/intermittently;Personal care attendant Type of Home: Apartment Home Access: Ramped entrance     Home Layout: One level     Bathroom Shower/Tub: Chief Strategy Officer: Standard     Home Equipment: Wheelchair - Surveyor, Quantity (2 wheels);Shower seat;BSC/3in1   Additional Comments: Pt with frequent hospitalizations and snf stays      Prior Functioning/Environment Prior Level of Function : Needs assist       Physical Assist : ADLs (physical)     Mobility Comments: walks with RW, power WC in community ADLs Comments: PCA 5 days a week 3 hrs, assist for bathing (sponge bathes), family/PCA does the IADLs, incontinent wears briefs    OT Problem List: Decreased strength;Decreased range of motion;Decreased activity tolerance;Impaired balance  (sitting and/or standing);Decreased cognition;Decreased safety awareness;Decreased knowledge of use of DME or AE;Cardiopulmonary status limiting activity   OT Treatment/Interventions: Self-care/ADL training;Therapeutic exercise;Energy conservation;DME and/or AE instruction;Therapeutic activities;Patient/family education;Balance training      OT Goals(Current goals can be found in the care plan section)   Acute Rehab OT Goals Patient Stated Goal: To go home OT Goal Formulation: With patient Time For Goal Achievement: 11/12/24 Potential to Achieve Goals: Good   OT Frequency:  Min 2X/week    Co-evaluation PT/OT/SLP Co-Evaluation/Treatment: Yes Reason for Co-Treatment: Complexity of the patient's impairments (multi-system involvement);Necessary to address cognition/behavior during functional activity PT goals addressed during session: Mobility/safety with mobility;Proper use of DME OT goals addressed during session: ADL's and self-care      AM-PAC OT 6 Clicks Daily Activity     Outcome Measure Help from another person eating meals?: None Help from another person taking care of personal grooming?: A Little Help from another person toileting, which includes using toliet, bedpan, or urinal?: Total Help from another person bathing (including washing, rinsing, drying)?: A Lot Help from another person to put on and taking off regular upper body clothing?: A Little Help from another person to put on and taking off regular lower body clothing?: A Lot 6 Click Score: 15   End of Session Equipment Utilized During Treatment: Gait belt;Rolling walker (2 wheels) Nurse Communication: Mobility status  Activity Tolerance: Patient tolerated treatment well Patient left: in chair;with call bell/phone within reach;with chair alarm set  OT Visit Diagnosis: Unsteadiness on feet (R26.81);Other abnormalities of gait  and mobility (R26.89);Muscle weakness (generalized) (M62.81);History of falling (Z91.81)                 Time: 9245-9161 OT Time Calculation (min): 44 min Charges:  OT General Charges $OT Visit: 1 Visit OT Evaluation $OT Eval Moderate Complexity: 1 Mod  Adrianne BROCKS, OT  Acute Rehabilitation Services Office [phone] Secure chat preferred   Adrianne GORMAN Savers 10/29/2024, 10:53 AM

## 2024-10-29 NOTE — Progress Notes (Deleted)
 PROGRESS NOTE Patricia Bradley  FMW:[id number] DOB: [birth year] DOA: 10/27/2024 PCP: Roxene Savant, NP  Brief Narrative/Hospital Course: Patricia Bradley is a 73 y.o. female with PMH of  ESRD on MWF, PAD, history of chronic lower extremity wounds and followed at the wound clinic, history of limb ischemia, uncontrolled hypertension with history of hypertensive urgency, OSA, diabetic polyneuropathy, obesity, emphysema, chronic debility and uses a walker for ambulation, with multiple recent admissions to atrium at [facility], 3 over the last couple month,  just discharged to [facility] 10/02/2024, and she was just discharged from subacute rehab 10/31 and apparently missed her dialysis presented to ED again with weakness, deconditioning. She does not make urine, no bowel incontinence, no new weakness in her legs, no new tingling, numbness, no dyspnea or chest pain In ED: elevated blood pressure maxed 210/109, but this has improved with resuming her home regimen CXRsignificant for cardiomegaly, labs-potassium 5.7, creatinine at 7.77, BUN at 41, BNP elevated at 419, troponins reassuring at 19> 15 MRI with no acute finding concerning for cord compression or high-grade spinal stenosis Patient was admitted for further management got HD seen by PTOT and plan for Home w/ HH.  Subjective: Seen and examined today She is at bedside chair On RA. 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  Has multiple comorbidities missed dialysis likely contributing to her symptoms. She has been admitted already 3 times at [facility], with workup significant for known L5/S1 stenosis.Repeat MRI lumbar spine with no acute  abnormality within the lumbar spine, no evidence of cord compression or high grade spinal stenosis Difficult disposition, PT eval appreciated this morning>Supervision-CGA level, is incontinent of bowel and confused. lives alone with PCA 5 days a week.  Per daughter they were not able to manage 24-hour supervision at home health. Per PT she needs ALF long term for the supervision.  This has been challenging-she has been from home to sleep in hospital and going in circles In that order.  ESRD on HD MWF Mild hyperkalemia: Missed HD 10/31, nephro consulted for HD.  Potassium improving Lokelma per nephrology  Uncontrolled hypertension/hypertensive urgency History of renal artery stenosis: Hypertensive in the setting of missing dialysis antihypertensive being resumed, continue amlodipine 2.5,Imdur 60.  Holding losartan for now Will defer antihypertensive management to nephrology given ESRD status.  Anemia of ESRD: Hemoglobin stable.  No ESA needs.  PAD with critical limb ischemia s/p left and right SFA/PTA angioplasty/stent: VVS following. Continue with DAPT and statin.  T2DM: Continue SSI  Gout: Continue allopurinol  COPD: Not in exacerbation. Med rec pending -mismatch with antihypertensives and home med regimen  Class I Obesity w/ Body mass index is 37.77 kg/m.: Will benefit with PCP follow-up, weight loss,healthy lifestyle and outpatient sleep eval if not done.  Mobility: PT Orders: Active PT Follow up Rec: Home Health Pt11/01/2024 0841   DVT prophylaxis: heparin injection 5,000 Units Start: 10/28/24 1400 Code Status:   Code Status: Full Code Family Communication: plan of care discussed with patient/daughter at bedside. Patient status is: Remains hospitalized because of severity of illness Level of care: Progressive   Dispo: The patient is from: Home w/ daughter , has home health aid/ptot.  Anticipated disposition: TBD  Objective: Vitals last 24 hrs: Vitals:    10/29/24 0305 10/29/24 0335 10/29/24 0835 10/29/24 0900  BP: (!) 180/70 (!) 146/58  136/82  Pulse:   (!) 59 60  Resp: 15 15  16   Temp:  98.4 F (36.9 C)  98.2 F (36.8 C)  TempSrc:  Oral  Oral  SpO2:  95%    Weight:      Height:       Physical Examination: General exam: alert awake, oriented x3 HEENT:Oral mucosa moist, Ear/Nose WNL grossly Respiratory system: Bilaterally clear BS,no use of accessory muscle Cardiovascular system: S1 & S2 +, No JVD. Gastrointestinal system: Abdomen soft,NT,ND, BS+ Nervous System: Alert, awake, moving all extremities,and following commands. Extremities: extremities warm, leg edema neg Skin: Warm, no rashes MSK: Normal muscle bulk,tone, power   Medications reviewed:  Scheduled Meds:  allopurinol  100 mg Oral Daily   amLODipine  2.5 mg Oral Daily   aspirin EC  81 mg Oral Daily   atorvastatin  20 mg Oral QHS   carvedilol  25 mg Oral BID WC   clopidogrel  75 mg Oral Daily   diclofenac Sodium  2 g Topical QID   fluticasone furoate-vilanterol  1 puff Inhalation Daily   heparin  5,000 Units Subcutaneous Q8H   hydrocortisone cream   Topical BID   insulin aspart  0-5 Units Subcutaneous QHS   insulin aspart  0-9 Units Subcutaneous TID WC   isosorbide mononitrate  60 mg Oral Daily   pantoprazole  40 mg Oral Daily   sodium zirconium cyclosilicate  10 g Oral Once   topiramate  100 mg Oral QHS   Continuous Infusions: Diet: Diet Order             Diet heart healthy/carb modified Room service appropriate? Yes; Fluid consistency: Thin  Diet effective now                  Data Reviewed: I have personally reviewed following labs and imaging studies ( see epic result tab) CBC: Recent Labs  Lab 10/28/24 0032 10/29/24 0537  WBC 8.0 4.8  NEUTROABS 5.9  --   HGB 12.3 12.7  HCT 40.3 41.3  MCV 88.4 88.2  PLT 227 199   CMP: Recent Labs  Lab 10/28/24 0032 10/29/24 0537  NA 136 136  K 5.7* 5.1  CL 95* 98  CO2 25 25  GLUCOSE 194* 97  BUN  41* 28*  CREATININE 7.77* 6.10*  CALCIUM 9.1 8.8*   GFR: Estimated Creatinine Clearance: 9.1 mL/min (A) (by C-G formula based on SCr of 6.1 mg/dL (H)). Recent Labs  Lab 10/28/24 0032  AST 22  ALT 12  ALKPHOS 88  BILITOT 0.8  PROT 7.1  ALBUMIN 3.6   No results for input(s): LIPASE, AMYLASE in the last 168 hours. No results for input(s): AMMONIA in the last 168 hours. Coagulation Profile: No results for input(s): INR, PROTIME in the last 168 hours. Unresulted Labs (From admission, onward)     Start     Ordered   10/30/24 0500  Basic metabolic panel with GFR  Daily,   R     Question:  Specimen collection method  Answer:  Lab=Lab collect   10/29/24 0825   10/30/24 0500  CBC  Daily,   R     Question:  Specimen collection method  Answer:  Lab=Lab collect   10/29/24 0825   10/29/24 0801  MRSA Next Gen by PCR, Nasal  Once,  R        10/29/24 0800   10/28/24 0830  Hepatitis B surface antibody,quantitative  (New Admission Hemo Labs (Hepatitis B))  Once,   R        10/28/24 0830   Signed and Held  Renal function panel  Once,   R       Question:  Specimen collection method  Answer:  Lab=Lab collect   Signed and Held   Signed and Held  CBC  Once,   R       Question:  Specimen collection method  Answer:  Lab=Lab collect   Signed and Held           Antimicrobials/Microbiology: Anti-infectives (From admission, onward)    None      No results found for: SDES, SPECREQUEST, CULT, REPTSTATUS  Procedures:    Mennie LAMY, MD [facility] 10/29/2024, 11:51 AM

## 2024-10-29 NOTE — Care Management CC44 (Signed)
 Condition Code 44 Documentation Completed  Patient Details  Name: Patricia Bradley MRN: [id number] Date of Birth: 06/20/1951   Condition Code 44 given:   yes Patient signature on Condition Code 44 notice:  yes Documentation of 2 MD's agreement:  yes  Code 44 added to claim: yes      Deztiny Sarra G., RN 10/29/2024, 9:26 AM

## 2024-10-29 NOTE — Hospital Course (Addendum)
 Patricia Bradley is a 73 y.o. female with PMH of  ESRD on MWF, PAD, history of chronic lower extremity wounds and followed at the wound clinic, history of limb ischemia, uncontrolled hypertension with history of hypertensive urgency, OSA, diabetic polyneuropathy, obesity, emphysema, chronic debility and uses a walker for ambulation, with multiple recent admissions to atrium at [facility], 3 over the last couple month,  just discharged to [facility] 10/02/2024, and she was just discharged from subacute rehab 10/31 and apparently missed her dialysis presented to ED again with weakness, deconditioning. She does not make urine, no bowel incontinence, no new weakness in her legs, no new tingling, numbness, no dyspnea or chest pain In ED: elevated blood pressure maxed 210/109, but this has improved with resuming her home regimen CXRsignificant for cardiomegaly, labs-potassium 5.7, creatinine at 7.77, BUN at 41, BNP elevated at 419, troponins reassuring at 19> 15 MRI with no acute finding concerning for cord compression or high-grade spinal stenosis Patient was admitted for further management got HD seen by PTOT and plan for Home w/ HH. Patient feels comfortable going home today daughter is in agreement discussion. Dr. Jama provide   Subjective: Seen and examined today She is at bedside chair On RA. 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  Has multiple comorbidities missed dialysis likely contributing to her symptoms. She has been admitted already 3 times at [facility], with workup significant for known L5/S1 stenosis.Repeat MRI lumbar spine with no acute abnormality within the lumbar spine, no  evidence of cord compression or high grade spinal stenosis She will go home w/ hh and daughter wi; provide 24 hr supervision at home [facility] confrmed HH set up  ESRD on HD MWF Mild hyperkalemia: Missed HD 10/31, nephro consulted for HD. Next HD Monday  Uncontrolled hypertension/hypertensive urgency History of renal artery stenosis: Hypertensive in the setting of missing dialysis antihypertensive being resumed, continue amlodipine 2.5,Imdur 60.  Holding losartan for now Will defer antihypertensive management to nephrology given ESRD status as outpatient. Will discharge on her current regimen  Anemia of ESRD: Hemoglobin stable.  No ESA needs.  PAD with critical limb ischemia s/p left and right SFA/PTA angioplasty/stent: VVS following. Continue with DAPT and statin.  T2DM: Continue SSI  Gout: Continue allopurinol  COPD: Not in exacerbation. Med rec pending -mismatch with antihypertensives and home med regimen Med discussed w/ her daughter  She will f/u pcp and her nephrology.  Class I Obesity w/ Body mass index is 37.77 kg/m.: Will benefit with PCP follow-up, weight loss,healthy lifestyle and outpatient sleep eval if not done.  Mobility: PT Orders: Active PT Follow up Rec: Home Health Pt11/01/2024 0841   DVT prophylaxis: heparin injection 5,000 Units Start: 10/28/24 1400 Code Status:   Code Status: Full Code Family Communication: plan of care discussed with patient/daughter at bedside. Patient status is: Remains hospitalized because of severity of illness Level of care: Progressive   Dispo: The patient is from: Home w/ daughter , has home health aid/ptot.            Anticipated disposition: TBD  Objective: Vitals last 24 hrs: Vitals:   10/29/24 0305 10/29/24 0335 10/29/24 0835 10/29/24 0900  BP: (!) 180/70 (!) 146/58  136/82  Pulse:   ROLLEN)  59 60  Resp: 15 15  16   Temp:  98.4 F (36.9 C)  98.2 F (36.8 C)  TempSrc:  Oral  Oral  SpO2:  95%    Weight:      Height:        Physical Examination: General exam: alert awake, oriented x3 HEENT:Oral mucosa moist, Ear/Nose WNL grossly Respiratory system: Bilaterally clear BS,no use of accessory muscle Cardiovascular system: S1 & S2 +, No JVD. Gastrointestinal system: Abdomen soft,NT,ND, BS+ Nervous System: Alert, awake, moving all extremities,and following commands. Extremities: extremities warm, leg edema neg Skin: Warm, no rashes MSK: Normal muscle bulk,tone, power

## 2024-10-29 NOTE — Evaluation (Signed)
 Physical Therapy Evaluation Patient Details Name: Trynity Skousen MRN: [id number] DOB: 03-01-1951 Today's Date: 10/29/2024  History of Present Illness  73 yo F adm 10/27/24 with weakness, hyperkalemia after missed HD. PMhx: ESRD on HD MWF, PAD, HTN, DM, polyneuropathy, emphysema, spinal stenosis, gout  Clinical Impression  Pt pleasant, confused and reports living at home alone. Pt with frequent hospital to SNF admissions as pt lives alone and is unable to successfully care for herself at home based on cognition and function. Discussed need of 24hr assist with daughter and potential for ALf as a long term solution. Daughter states family will plan to provide 24hr supervision at this time with HHPT and work toward looking at ALF. If family unable to solidify 24hr care Patient will benefit from continued inpatient follow up therapy, <3 hours/day in order to arrange long term care. Pt with decreased strength, function, transfers, mobility and balance who will benefit from acute therapy to maximize mobility, safety and independence.  HR 59-83         If plan is discharge home, recommend the following: A little help with walking and/or transfers;A little help with bathing/dressing/bathroom;Assistance with cooking/housework;Direct supervision/assist for financial management;Supervision due to cognitive status;Assist for transportation   Can travel by private vehicle        Equipment Recommendations None recommended by PT  Recommendations for Other Services       Functional Status Assessment Patient has had a recent decline in their functional status and/or demonstrates limited ability to make significant improvements in function in a reasonable and predictable amount of time     Precautions / Restrictions Precautions Precautions: Fall;Other (comment) Recall of Precautions/Restrictions: Impaired Precaution/Restrictions Comments: incontinent bowel      Mobility  Bed Mobility Overal bed  mobility: Needs Assistance Bed Mobility: Supine to Sit     Supine to sit: Contact guard     General bed mobility comments: HOB 25 degrees with rail, increased time and cues for safety and function    Transfers Overall transfer level: Needs assistance   Transfers: Sit to/from Stand Sit to Stand: Min assist, Contact guard assist           General transfer comment: min assist to rise from bed, CGA to rise from [facility] with cues for hand placement, sequence and safety. Pt sits prematurely with min assist to control descent    Ambulation/Gait Ambulation/Gait assistance: Min assist Gait Distance (Feet): 70 Feet Assistive device: Rolling walker (2 wheels) Gait Pattern/deviations: Step-through pattern, Decreased stride length, Trunk flexed   Gait velocity interpretation: <1.8 ft/sec, indicate of risk for recurrent falls   General Gait Details: pt walked 54' with RW prior to pt leaning over RW and needing min assist to recover, pt incontinent of bowel during gait and unaware with assist for pericare and linen change. additional 20' after seated rest at [facility] with CGA  Stairs            Wheelchair Mobility     Tilt Bed    Modified Rankin (Stroke Patients Only)       Balance Overall balance assessment: Needs assistance Sitting-balance support: No upper extremity supported, Feet supported Sitting balance-Leahy Scale: Fair     Standing balance support: Bilateral upper extremity supported, Reliant on assistive device for balance Standing balance-Leahy Scale: Poor Standing balance comment: Rw in standing                             Pertinent Vitals/Pain  Pain Assessment Pain Assessment: No/denies pain    Home Living Family/patient expects to be discharged to:: Private residence Living Arrangements: Alone Available Help at Discharge: Family;Available PRN/intermittently;Personal care attendant Type of Home: Apartment Home Access: Ramped entrance        Home Layout: One level Home Equipment: Wheelchair - Surveyor, Quantity (2 wheels);Shower seat;BSC/3in1      Prior Function Prior Level of Function : Needs assist       Physical Assist : ADLs (physical)     Mobility Comments: walks with RW, power WC in community ADLs Comments: PCA 5 days a week 3 hrs, assist for bathing (sponge bathes), family/PCA does the IADLs, doesn't drive     Extremity/Trunk Assessment   Upper Extremity Assessment Upper Extremity Assessment: Generalized weakness    Lower Extremity Assessment Lower Extremity Assessment: Generalized weakness    Cervical / Trunk Assessment Cervical / Trunk Assessment: Kyphotic  Communication   Communication Communication: No apparent difficulties    Cognition Arousal: Alert Behavior During Therapy: Flat affect   PT - Cognitive impairments: Orientation, Memory, Attention, Safety/Judgement, Problem solving                       PT - Cognition Comments: pt not oriented to facility, knows she is in the hospital, not oriented to day but month, decreased safety awareness Following commands: Impaired Following commands impaired: Only follows one step commands consistently     Cueing Cueing Techniques: Verbal cues     General Comments      Exercises     Assessment/Plan    PT Assessment Patient needs continued PT services  PT Problem List Decreased strength;Decreased coordination;Decreased cognition;Decreased activity tolerance;Decreased mobility;Decreased balance;Decreased safety awareness;Decreased knowledge of use of DME       PT Treatment Interventions DME instruction;Balance training;Gait training;Functional mobility training;Therapeutic activities;Therapeutic exercise;Patient/family education;Cognitive remediation    PT Goals (Current goals can be found in the Care Plan section)  Acute Rehab PT Goals Patient Stated Goal: return home PT Goal Formulation: With patient/family Time For Goal  Achievement: 11/12/24 Potential to Achieve Goals: Fair    Frequency Min 2X/week     Co-evaluation PT/OT/SLP Co-Evaluation/Treatment: Yes Reason for Co-Treatment: Complexity of the patient's impairments (multi-system involvement);Necessary to address cognition/behavior during functional activity PT goals addressed during session: Mobility/safety with mobility;Proper use of DME         AM-PAC PT 6 Clicks Mobility  Outcome Measure Help needed turning from your back to your side while in a flat bed without using bedrails?: A Little Help needed moving from lying on your back to sitting on the side of a flat bed without using bedrails?: A Little Help needed moving to and from a bed to a chair (including a wheelchair)?: A Little Help needed standing up from a chair using your arms (e.g., wheelchair or bedside chair)?: A Little Help needed to walk in hospital room?: A Little Help needed climbing 3-5 steps with a railing? : A Lot 6 Click Score: 17    End of Session Equipment Utilized During Treatment: Gait belt Activity Tolerance: Patient tolerated treatment well Patient left: in chair;with call bell/phone within reach;with chair alarm set Nurse Communication: Mobility status PT Visit Diagnosis: Other abnormalities of gait and mobility (R26.89);Difficulty in walking, not elsewhere classified (R26.2);Muscle weakness (generalized) (M62.81)    Time: 9245-9161 PT Time Calculation (min) (ACUTE ONLY): 44 min   Charges:   PT Evaluation $PT Eval Moderate Complexity: 1 Mod PT Treatments $Gait Training: 8-22 mins PT  General Charges $$ ACUTE PT VISIT: 1 Visit         Lenoard SQUIBB, PT Acute Rehabilitation Services Office: [phone]   Lenoard NOVAK Kassadee Carawan 10/29/2024, 8:45 AM

## 2024-10-29 NOTE — Care Management Obs Status (Signed)
 MEDICARE OBSERVATION STATUS NOTIFICATION   Patient Details  Name: Patricia Bradley MRN: [id number] Date of Birth: 27-Sep-1951   Medicare Observation Status Notification Given:  Yes    Revella Shelton G., RN 10/29/2024, 9:25 AM

## 2024-10-29 NOTE — Discharge Summary (Signed)
 Physician Discharge Summary  Patricia Bradley FMW:[id number] DOB: [birth year] DOA: 10/27/2024  PCP: Roxene Savant, NP  Admit date: 10/27/2024 Discharge date: 10/29/2024 Recommendations for Outpatient Follow-up:  Follow up with PCP in 1 weeks-call for appointment Please obtain BMP/CBC in one week Fu nephro and PCP  Discharge Dispo: Home w [facility] Discharge Condition: Stable Code Status:   Code Status: Full Code Diet recommendation:  Diet Order             Diet renal with fluid restriction           Diet heart healthy/carb modified Room service appropriate? Yes; Fluid consistency: Thin  Diet effective now                    Brief/Interim Summary: Patricia Bradley is a 73 y.o. female with PMH of  ESRD on MWF, PAD, history of chronic lower extremity wounds and followed at the wound clinic, history of limb ischemia, uncontrolled hypertension with history of hypertensive urgency, OSA, diabetic polyneuropathy, obesity, emphysema, chronic debility and uses a walker for ambulation, with multiple recent admissions to atrium at [facility], 3 over the last couple month,  just discharged to [facility] 10/02/2024, and she was just discharged from subacute rehab 10/31 and apparently missed her dialysis presented to ED again with weakness, deconditioning. She does not make urine, no bowel incontinence, no new weakness in her legs, no new tingling, numbness, no dyspnea or chest pain In ED: elevated blood pressure maxed 210/109, but this has improved with resuming her home regimen CXRsignificant for cardiomegaly, labs-potassium 5.7, creatinine at 7.77, BUN at 41, BNP elevated at 419, troponins reassuring at 19> 15 MRI with no acute finding concerning for cord compression or high-grade spinal stenosis Patient was admitted for further management got HD seen by PTOT and plan for Home w/ HH. Patient feels comfortable going home today daughter is in agreement discussion. Dr. Jama provide   Subjective: Seen and  examined today She is at bedside chair On RA. 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  Has multiple comorbidities missed dialysis likely contributing to her symptoms. She has been admitted already 3 times at [facility], with workup significant for known L5/S1 stenosis.Repeat MRI lumbar spine with no acute abnormality within the lumbar spine, no evidence of cord compression or high grade spinal stenosis She will go home w/ hh and daughter wi; provide 24 hr supervision at home [facility] confrmed HH set up  ESRD on HD MWF Mild hyperkalemia: Missed HD 10/31, nephro consulted for HD. Next HD Monday  Uncontrolled hypertension/hypertensive urgency History of renal artery stenosis: Hypertensive in the setting of missing dialysis antihypertensive being resumed, continue amlodipine 2.5,Imdur 60.  Holding losartan for now Will defer antihypertensive management to nephrology given ESRD status as outpatient. Will discharge on her current regimen  Anemia of ESRD: Hemoglobin stable.  No ESA needs.  PAD with critical limb ischemia s/p left and right SFA/PTA angioplasty/stent: VVS following. Continue with DAPT and statin.  T2DM: Continue SSI  Gout: Continue allopurinol  COPD: Not in exacerbation. Med rec pending -mismatch with antihypertensives and home med regimen Med discussed w/ her daughter  She will f/u pcp and her nephrology.  Class I Obesity w/ Body mass  index is 37.77 kg/m.: Will benefit with PCP follow-up, weight loss,healthy lifestyle and outpatient sleep eval if not done.  Mobility: PT Orders: Active PT Follow up Rec: Home Health Pt11/01/2024 0841   DVT prophylaxis:  heparin injection 5,000 Units Start: 10/28/24 1400 Code Status:   Code Status: Full Code Family Communication: plan of care discussed with patient/daughter at bedside. Patient status is: Remains hospitalized because of severity of illness Level of care: Progressive   Dispo: The patient is from: Home w/ daughter , has home health aid/ptot.            Anticipated disposition: TBD  Objective: Vitals last 24 hrs: Vitals:   10/29/24 0305 10/29/24 0335 10/29/24 0835 10/29/24 0900  BP: (!) 180/70 (!) 146/58  136/82  Pulse:   (!) 59 60  Resp: 15 15  16   Temp:  98.4 F (36.9 C)  98.2 F (36.8 C)  TempSrc:  Oral  Oral  SpO2:  95%    Weight:      Height:       Physical Examination: General exam: alert awake, oriented x3 HEENT:Oral mucosa moist, Ear/Nose WNL grossly Respiratory system: Bilaterally clear BS,no use of accessory muscle Cardiovascular system: S1 & S2 +, No JVD. Gastrointestinal system: Abdomen soft,NT,ND, BS+ Nervous System: Alert, awake, moving all extremities,and following commands. Extremities: extremities warm, leg edema neg Skin: Warm, no rashes MSK: Normal muscle bulk,tone, power        Consultation: See note.  Discharge Instructions  Discharge Instructions     (HEART FAILURE PATIENTS) Call MD:  Anytime you have any of the following symptoms: 1) 3 pound weight gain in 24 hours or 5 pounds in 1 week 2) shortness of breath, with or without a dry hacking cough 3) swelling in the hands, feet or stomach 4) if you have to sleep on extra pillows at night in order to breathe.   Complete by: As directed    Diet renal with fluid restriction   Complete by: As directed    Discharge instructions   Complete by: As directed    Please call call MD or return to ER for similar or worsening recurring problem that brought you to hospital or if any fever,nausea/vomiting,abdominal pain, uncontrolled pain, chest pain,  shortness of breath or any other alarming symptoms.  Please  follow-up your doctor as instructed in a week time and call the office for appointment.  Please avoid alcohol, smoking, or any other illicit substance and maintain healthy habits including taking your regular medications as prescribed.  You were cared for by a hospitalist during your hospital stay. If you have any questions about your discharge medications or the care you received while you were in the hospital after you are discharged, you can call the unit and ask to speak with the hospitalist on call if the hospitalist that took care of you is not available.  Once you are discharged, your primary care physician will handle any further medical issues. Please note that NO REFILLS for any discharge medications will be authorized once you are discharged, as it is imperative that you return to your primary care physician (or establish a relationship with a primary care physician if you do not have one) for your aftercare needs so that they can reassess your need for medications and monitor your lab values.   Increase activity slowly   Complete by: As directed       Allergies as of 10/29/2024       Reactions  Tramadol Other (See Comments)   Seizures.   Memantine Other (See Comments)    Dizziness ,Headache   Metformin Diarrhea   Penicillins Other (See Comments)   Has patient had a PCN reaction causing immediate rash, facial/tongue/throat swelling, SOB or lightheadedness with hypotension: Unknown Has patient had a PCN reaction causing severe rash involving mucus membranes or skin necrosis: Unknown Has patient had a PCN reaction that required hospitalization: Unknown Has patient had a PCN reaction occurring within the last 10 years: Unknown If all of the above answers are NO, then may proceed with Cephalosporin use.   Bactoshield Chg [chlorhexidine Gluconate] Itching   Wipes    Contrast Media [iodinated Contrast Media] Hives, Itching   Advised to never taken contrast again    Aspirin Other  (See Comments)    EPISTAXIS Nosebleed, can tolerate low dose   Fentanyl Itching   Patch.   Hydrocodone-acetaminophen  Nausea Only        Medication List     STOP taking these medications    Basaglar KwikPen 100 UNIT/ML   HumaLOG KwikPen 100 UNIT/ML KiwkPen Generic drug: insulin lispro   metoprolol succinate 50 MG 24 hr tablet Commonly known as: TOPROL-XL   Mounjaro 5 MG/0.5ML Pen Generic drug: tirzepatide   omeprazole 20 MG capsule Commonly known as: PRILOSEC   rosuvastatin 10 MG tablet Commonly known as: CRESTOR   torsemide 20 MG tablet Commonly known as: DEMADEX       TAKE these medications    albuterol  108 (90 Base) MCG/ACT inhaler Commonly known as: VENTOLIN  HFA Inhale 2 puffs into the lungs every 6 (six) hours as needed for wheezing or shortness of breath.   allopurinol 100 MG tablet Commonly known as: ZYLOPRIM Take 100 mg by mouth daily.   ALPRAZolam 0.25 MG tablet Commonly known as: XANAX Take 0.25 mg by mouth daily as needed for anxiety.   amLODipine 2.5 MG tablet Commonly known as: NORVASC Take 1 tablet (2.5 mg total) by mouth daily. What changed: Another medication with the same name was removed. Continue taking this medication, and follow the directions you see here.   aspirin EC 81 MG tablet Take 81 mg by mouth daily.   atorvastatin 20 MG tablet Commonly known as: LIPITOR Take 1 tablet (20 mg total) by mouth at bedtime.   benzonatate  100 MG capsule Commonly known as: TESSALON  Take 2 capsules (200 mg total) by mouth 2 (two) times daily as needed for cough.   calcium acetate 667 MG capsule Commonly known as: PHOSLO Take 667-1,334 mg by mouth See admin instructions. Take 1334mg  three times a day after meals, Take 667mg  after snacks   carvedilol 25 MG tablet Commonly known as: COREG Take 1 tablet (25 mg total) by mouth 2 (two) times daily with a meal. What changed: when to take this   Centrum Silver 50+Women Tabs Take 1 tablet by  mouth daily.   cetirizine 10 MG tablet Commonly known as: ZYRTEC Take 5 mg by mouth daily.   Cholecalciferol 50 MCG (2000 UT) Caps Take 2,000 Units by mouth daily.   clobetasol ointment 0.05 % Commonly known as: TEMOVATE Apply 1 application topically daily. To feet   clopidogrel 75 MG tablet Commonly known as: PLAVIX Take 75 mg by mouth daily.   clotrimazole-betamethasone lotion Commonly known as: LOTRISONE Apply 1 application topically 2 (two) times daily.   donepezil 10 MG tablet Commonly known as: ARICEPT Take 10 mg by mouth daily.   esomeprazole 40 MG capsule Commonly known as: NEXIUM  Take 40 mg by mouth daily at 12 noon.   famotidine  20 MG tablet Commonly known as: Pepcid  Take 1 tablet (20 mg total) by mouth 2 (two) times daily.   febuxostat 40 MG tablet Commonly known as: ULORIC Take 40 mg by mouth daily.   fluticasone 50 MCG/ACT nasal spray Commonly known as: FLONASE Place 1 spray into both nostrils daily.   Fluticasone-Salmeterol 100-50 MCG/DOSE Aepb Commonly known as: ADVAIR Inhale 1 puff into the lungs 2 (two) times daily.   gabapentin 300 MG capsule Commonly known as: NEURONTIN Take 300 mg by mouth at bedtime.   glipiZIDE 5 MG 24 hr tablet Commonly known as: GLUCOTROL XL Take 5 mg by mouth.   hydrocortisone 2.5 % cream Apply 1 application topically 2 (two) times daily.   hydrOXYzine 50 MG tablet Commonly known as: ATARAX Take 50 mg by mouth.   ipratropium 0.06 % nasal spray Commonly known as: ATROVENT Place 2 sprays into both nostrils 3 (three) times daily.   isosorbide mononitrate 60 MG 24 hr tablet Commonly known as: IMDUR Take 1 tablet (60 mg total) by mouth daily. What changed: Another medication with the same name was removed. Continue taking this medication, and follow the directions you see here.   lidocaine 5 % ointment Commonly known as: XYLOCAINE Apply 1 application topically daily as needed for mild pain.   losartan 25 MG  tablet Commonly known as: COZAAR Take 25 mg by mouth daily.   Naftin 2 % Crea Generic drug: Naftifine HCl Apply 1 g topically 2 (two) times daily. Starting at feet   nitroGLYCERIN 0.4 MG SL tablet Commonly known as: NITROSTAT Place 0.4 mg under the tongue every 5 (five) minutes as needed for chest pain.   NON FORMULARY CPAP Nightly   omega-3 fish oil 1000 MG Caps capsule Commonly known as: MAXEPA Take 1 capsule by mouth daily.   ondansetron 4 MG disintegrating tablet Commonly known as: ZOFRAN-ODT Take 4 mg by mouth every 8 (eight) hours as needed for nausea or vomiting.   topiramate 100 MG tablet Commonly known as: TOPAMAX Take 100 mg by mouth at bedtime.        Follow-up Information     Altidor, Jayson, NP Follow up in 1 week(s).   Specialty: Family Medicine Contact information: [address] [place] [address] [phone]         [facility] Follow up.   Specialty: Home Health Services Why: Home health services. Office will call to arrange follow up after hospital discharge. Contact information: [address] [place] [address] [phone]                Allergies  Allergen Reactions   Tramadol Other (See Comments)    Seizures.    Memantine Other (See Comments)     Dizziness ,Headache     Metformin Diarrhea   Penicillins Other (See Comments)    Has patient had a PCN reaction causing immediate rash, facial/tongue/throat swelling, SOB or lightheadedness with hypotension: Unknown Has patient had a PCN reaction causing severe rash involving mucus membranes or skin necrosis: Unknown Has patient had a PCN reaction that required hospitalization: Unknown Has patient had a PCN reaction occurring within the last 10 years: Unknown If all of the above answers are NO, then may proceed with Cephalosporin use.     Bactoshield Chg [Chlorhexidine Gluconate] Itching    Wipes    Contrast Media [Iodinated  Contrast Media] Hives and  Itching    Advised to never taken contrast again    Aspirin Other (See Comments)     EPISTAXIS Nosebleed, can tolerate low dose    Fentanyl Itching    Patch.     Hydrocodone-Acetaminophen  Nausea Only    The results of significant diagnostics from this hospitalization (including imaging, microbiology, ancillary and laboratory) are listed below for reference.    Microbiology: No results found for this or any previous visit (from the past 240 hours).  Procedures/Studies: DG Chest 2 View Result Date: 10/28/2024 EXAM: AP AND LATERAL (2) VIEW(S) XRAY OF THE CHEST 10/28/2024 03:58:00 AM COMPARISON: AP and lateral radiographs of the chest dated 06/29/2023. CLINICAL HISTORY: sob FINDINGS: LUNGS AND PLEURA: No focal pulmonary opacity. No pulmonary edema. No pleural effusion. No pneumothorax. HEART AND MEDIASTINUM: Cardiomegaly. Calcified aorta. BONES AND SOFT TISSUES: No acute osseous abnormality. IMPRESSION: 1. Cardiomegaly and calcified aorta. Electronically signed by: Evalene Coho MD 10/28/2024 04:11 AM EDT RP Workstation: [id number]   MR LUMBAR SPINE WO CONTRAST Result Date: 10/28/2024 CLINICAL DATA:  Initial evaluation for acute low back pain, cauda equina syndrome. EXAM: MRI LUMBAR SPINE WITHOUT CONTRAST TECHNIQUE: Multiplanar, multisequence MR imaging of the lumbar spine was performed. No intravenous contrast was administered. COMPARISON:  None Available. FINDINGS: Segmentation: Standard. Lowest well-formed disc space labeled the L5-S1 level. Alignment: 3 mm facet mediated anterolisthesis of L4 on L5 and L5 on S1. Alignment otherwise normal preservation of the normal lumbar lordosis. Vertebrae: Vertebral body height maintained without acute or chronic fracture. Diffusely decreased T1 signal intensity throughout the visualized bone marrow, suspected be related to patient history of end-stage renal disease. No worrisome osseous lesions. No significant abnormal marrow  edema. Conus medullaris and cauda equina: Conus extends to the L1-2 level. Conus and cauda equina appear normal. Paraspinal and other soft tissues: Diffuse fluid signal intensity within the subcutaneous fat of lower back, likely related to overall volume status. Kidneys are markedly atrophic bilaterally with a few small T2 hyperintense cysts, benign in appearance, no follow-up imaging recommended. Disc levels: L1-2: Disc desiccation with minimal annular disc bulge. Mild facet hypertrophy. No canal stenosis. Foramina remain patent. L2-3: Disc desiccation with mild diffuse disc bulge. Mild bilateral facet hypertrophy. No significant spinal stenosis. Foramina remain patent. L3-4: Mild intervertebral disc space narrowing with disc desiccation and diffuse disc bulge. Reactive endplate spurring. Mild bilateral facet hypertrophy. No significant spinal stenosis. Mild to moderate bilateral L3 foraminal narrowing. L4-5: Trace anterolisthesis. Disc desiccation with diffuse disc bulge. Reactive endplate spurring. Moderate bilateral facet arthrosis. Resultant mild narrowing of the lateral recesses bilaterally. Central canal remains patent. Moderate right with mild to moderate left L4 foraminal stenosis. L5-S1: Trace anterolisthesis. Disc desiccation without disc bulge. Severe bilateral facet arthrosis. No significant spinal stenosis. Mild right L5 foraminal narrowing. Left neural foramina remains patent. IMPRESSION: 1. No acute abnormality within the lumbar spine. No evidence for cord compression or high-grade spinal stenosis. 2. Multifactorial degenerative changes at L4-5 with resultant mild bilateral lateral recess stenosis, with moderate right and mild to moderate left L4 foraminal narrowing. 3. Mild to moderate bilateral L3 foraminal stenosis related to disc bulge and facet hypertrophy. 4. Severe bilateral facet arthrosis at L5-S1 with associated mild right L5 foraminal stenosis. Electronically Signed   By: Morene Hoard M.D.   On: 10/28/2024 01:40    Labs: BNP (last 3 results) Recent Labs    10/28/24 0036  BNP 419.6*   Basic Metabolic Panel: Recent Labs  Lab 10/28/24 0032 10/29/24 0537  NA  136 136  K 5.7* 5.1  CL 95* 98  CO2 25 25  GLUCOSE 194* 97  BUN 41* 28*  CREATININE 7.77* 6.10*  CALCIUM 9.1 8.8*   Liver Function Tests: Recent Labs  Lab 10/28/24 0032  AST 22  ALT 12  ALKPHOS 88  BILITOT 0.8  PROT 7.1  ALBUMIN 3.6   No results for input(s): LIPASE, AMYLASE in the last 168 hours. No results for input(s): AMMONIA in the last 168 hours. CBC: Recent Labs  Lab 10/28/24 0032 10/29/24 0537  WBC 8.0 4.8  NEUTROABS 5.9  --   HGB 12.3 12.7  HCT 40.3 41.3  MCV 88.4 88.2  PLT 227 199   CBG: Recent Labs  Lab 10/28/24 1828 10/28/24 2135 10/29/24 0901 10/29/24 1330 10/29/24 1630  GLUCAP 105* 177* 91 130* 152*   Hgb A1c Recent Labs    10/28/24 1849  HGBA1C 4.9   Anemia work up No results for input(s): VITAMINB12, FOLATE, FERRITIN, TIBC, IRON, RETICCTPCT in the last 72 hours. Cardiac Enzymes: No results for input(s): CKTOTAL, CKMB, CKMBINDEX, TROPONINI in the last 168 hours. BNP: Invalid input(s): POCBNP D-Dimer No results for input(s): DDIMER in the last 72 hours. Lipid Profile No results for input(s): CHOL, HDL, LDLCALC, TRIG, CHOLHDL, LDLDIRECT in the last 72 hours. Thyroid function studies No results for input(s): TSH, T4TOTAL, T3FREE, THYROIDAB in the last 72 hours.  Invalid input(s): FREET3 Urinalysis No results found for: COLORURINE, APPEARANCEUR, LABSPEC, PHURINE, GLUCOSEU, HGBUR, BILIRUBINUR, KETONESUR, PROTEINUR, UROBILINOGEN, NITRITE, LEUKOCYTESUR Sepsis Labs Recent Labs  Lab 10/28/24 0032 10/29/24 0537  WBC 8.0 4.8   Microbiology No results found for this or any previous visit (from the past 240 hours).   Time coordinating discharge: 25  minutes  SIGNED: Mennie LAMY, MD  [facility] 10/29/2024, 4:55 PM  If 7PM-7AM, please contact night-coverage [email or web address]

## 2024-10-29 NOTE — Progress Notes (Signed)
 [facility] Progress Note   Subjective:   Feeling better post HD, eating breakfast. Denies SOB, CP, dizziness.   Objective Vitals:   10/29/24 0305 10/29/24 0335 10/29/24 0835 10/29/24 0900  BP: (!) 180/70 (!) 146/58  136/82  Pulse:   (!) 59 60  Resp: 15 15  16   Temp:  98.4 F (36.9 C)  98.2 F (36.8 C)  TempSrc:  Oral  Oral  SpO2:  95%    Weight:      Height:       Physical Exam General: alert female in NAD Heart: RRR, no murmurs, rubs or gallops Lungs: CTA bilaterally, respirations unlabored Abdomen: Soft, non-distended, +BS Extremities: No edema b/l lower extremities Dialysis Access:  AVF +t/b  Additional Objective Labs: Basic Metabolic Panel: Recent Labs  Lab 10/28/24 0032 10/29/24 0537  NA 136 136  K 5.7* 5.1  CL 95* 98  CO2 25 25  GLUCOSE 194* 97  BUN 41* 28*  CREATININE 7.77* 6.10*  CALCIUM 9.1 8.8*   Liver Function Tests: Recent Labs  Lab 10/28/24 0032  AST 22  ALT 12  ALKPHOS 88  BILITOT 0.8  PROT 7.1  ALBUMIN 3.6   No results for input(s): LIPASE, AMYLASE in the last 168 hours. CBC: Recent Labs  Lab 10/28/24 0032 10/29/24 0537  WBC 8.0 4.8  NEUTROABS 5.9  --   HGB 12.3 12.7  HCT 40.3 41.3  MCV 88.4 88.2  PLT 227 199   Blood Culture No results found for: SDES, SPECREQUEST, CULT, REPTSTATUS  Cardiac Enzymes: No results for input(s): CKTOTAL, CKMB, CKMBINDEX, TROPONINI in the last 168 hours. CBG: Recent Labs  Lab 10/28/24 1828 10/28/24 2135 10/29/24 0901  GLUCAP 105* 177* 91   Iron Studies: No results for input(s): IRON, TIBC, TRANSFERRIN, FERRITIN in the last 72 hours. @lablastinr3 @ Studies/Results: DG Chest 2 View Result Date: 10/28/2024 EXAM: AP AND LATERAL (2) VIEW(S) XRAY OF THE CHEST 10/28/2024 03:58:00 AM COMPARISON: AP and lateral radiographs of the chest dated 06/29/2023. CLINICAL HISTORY: sob FINDINGS: LUNGS AND PLEURA: No focal pulmonary opacity. No pulmonary edema. No  pleural effusion. No pneumothorax. HEART AND MEDIASTINUM: Cardiomegaly. Calcified aorta. BONES AND SOFT TISSUES: No acute osseous abnormality. IMPRESSION: 1. Cardiomegaly and calcified aorta. Electronically signed by: Evalene Coho MD 10/28/2024 04:11 AM EDT RP Workstation: [id number]   MR LUMBAR SPINE WO CONTRAST Result Date: 10/28/2024 CLINICAL DATA:  Initial evaluation for acute low back pain, cauda equina syndrome. EXAM: MRI LUMBAR SPINE WITHOUT CONTRAST TECHNIQUE: Multiplanar, multisequence MR imaging of the lumbar spine was performed. No intravenous contrast was administered. COMPARISON:  None Available. FINDINGS: Segmentation: Standard. Lowest well-formed disc space labeled the L5-S1 level. Alignment: 3 mm facet mediated anterolisthesis of L4 on L5 and L5 on S1. Alignment otherwise normal preservation of the normal lumbar lordosis. Vertebrae: Vertebral body height maintained without acute or chronic fracture. Diffusely decreased T1 signal intensity throughout the visualized bone marrow, suspected be related to patient history of end-stage renal disease. No worrisome osseous lesions. No significant abnormal marrow edema. Conus medullaris and cauda equina: Conus extends to the L1-2 level. Conus and cauda equina appear normal. Paraspinal and other soft tissues: Diffuse fluid signal intensity within the subcutaneous fat of lower back, likely related to overall volume status. Kidneys are markedly atrophic bilaterally with a few small T2 hyperintense cysts, benign in appearance, no follow-up imaging recommended. Disc levels: L1-2: Disc desiccation with minimal annular disc bulge. Mild facet hypertrophy. No canal stenosis. Foramina remain patent. L2-3: Disc desiccation with  mild diffuse disc bulge. Mild bilateral facet hypertrophy. No significant spinal stenosis. Foramina remain patent. L3-4: Mild intervertebral disc space narrowing with disc desiccation and diffuse disc bulge. Reactive endplate spurring. Mild  bilateral facet hypertrophy. No significant spinal stenosis. Mild to moderate bilateral L3 foraminal narrowing. L4-5: Trace anterolisthesis. Disc desiccation with diffuse disc bulge. Reactive endplate spurring. Moderate bilateral facet arthrosis. Resultant mild narrowing of the lateral recesses bilaterally. Central canal remains patent. Moderate right with mild to moderate left L4 foraminal stenosis. L5-S1: Trace anterolisthesis. Disc desiccation without disc bulge. Severe bilateral facet arthrosis. No significant spinal stenosis. Mild right L5 foraminal narrowing. Left neural foramina remains patent. IMPRESSION: 1. No acute abnormality within the lumbar spine. No evidence for cord compression or high-grade spinal stenosis. 2. Multifactorial degenerative changes at L4-5 with resultant mild bilateral lateral recess stenosis, with moderate right and mild to moderate left L4 foraminal narrowing. 3. Mild to moderate bilateral L3 foraminal stenosis related to disc bulge and facet hypertrophy. 4. Severe bilateral facet arthrosis at L5-S1 with associated mild right L5 foraminal stenosis. Electronically Signed   By: Morene Hoard M.D.   On: 10/28/2024 01:40   Medications:   allopurinol  100 mg Oral Daily   amLODipine  2.5 mg Oral Daily   aspirin EC  81 mg Oral Daily   atorvastatin  20 mg Oral QHS   carvedilol  25 mg Oral BID WC   clopidogrel  75 mg Oral Daily   diclofenac Sodium  2 g Topical QID   fluticasone furoate-vilanterol  1 puff Inhalation Daily   heparin  5,000 Units Subcutaneous Q8H   hydrocortisone cream   Topical BID   insulin aspart  0-5 Units Subcutaneous QHS   insulin aspart  0-9 Units Subcutaneous TID WC   isosorbide mononitrate  60 mg Oral Daily   pantoprazole  40 mg Oral Daily   sodium zirconium cyclosilicate  10 g Oral Once   topiramate  100 mg Oral QHS    Dialysis Orders:MWF HD Dr Erskin /  [facility] 3h   B400  95.5kg  AVF Heparin not  sure  Assessment/Plan: Generalized weakness: just dc'd from SNF facility. Per pmd.  ESRD: on HD MWF. Missed HD Friday, had HD Saturday with no issues HTN: is on multiple home BP lowering meds. Re-introduce home BP meds prn per pmd.  Volume: euvolemic on exam, UF goal 2-2.5 L as tolerated.  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  Lucie Collet, PA-C 10/29/2024, 11:04 AM  [facility] Pager: [phone]

## 2024-10-29 NOTE — TOC Transition Note (Signed)
 Transition of Care [facility]) - Discharge Note   Patient Details  Name: Patricia Bradley MRN: [id number] Date of Birth: 1951-12-03  [facility] of [facility] CM/SW Contact:  Robynn Eileen Hoose, RN Phone Number: 10/29/2024, 4:50 PM   Clinical Narrative:   Secure message from provider regarding patient current home health services. Patient currently with [facility] for Nursing and PT services. Provider made aware.          Patient Goals and CMS Choice            Discharge Placement                       Discharge Plan and Services Additional resources added to the After Visit Summary for                                       Social Drivers of Health (SDOH) Interventions SDOH Screenings   Food Insecurity: No Food Insecurity (10/28/2024)  Housing: Low Risk  (10/28/2024)  Transportation Needs: No Transportation Needs (10/28/2024)  Utilities: Not At Risk (10/28/2024)  Social Connections: Moderately Integrated (10/28/2024)  Tobacco Use: Low Risk  (10/28/2024)     Readmission Risk Interventions     No data to display

## 2024-11-01 LAB — HEPATITIS B SURFACE ANTIBODY, QUANTITATIVE: Hep B S AB Quant (Post): <3.5 m[IU]/mL — ABNORMAL LOW
